# Patient Record
Sex: Male | Born: 1995
Health system: Southern US, Community
[De-identification: ages and names within clinical notes are randomized; demographics above are authoritative.]

## PROBLEM LIST (undated history)

## (undated) DIAGNOSIS — F1010 Alcohol abuse, uncomplicated: Secondary | ICD-10-CM

## (undated) DIAGNOSIS — Z8659 Personal history of other mental and behavioral disorders: Secondary | ICD-10-CM

## (undated) DIAGNOSIS — G43909 Migraine, unspecified, not intractable, without status migrainosus: Secondary | ICD-10-CM

## (undated) HISTORY — DX: Personal history of other mental and behavioral disorders: Z86.59

## (undated) HISTORY — DX: Migraine, unspecified, not intractable, without status migrainosus: G43.909

## (undated) HISTORY — DX: Alcohol abuse, uncomplicated: F10.10

---
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
## 2015-06-11 ENCOUNTER — Encounter: Payer: Self-pay | Admitting: *Deleted

## 2015-06-11 ENCOUNTER — Emergency Department
Admission: EM | Admit: 2015-06-11 | Discharge: 2015-06-11 | Disposition: A | Discharge status: Home / Self Care | Payer: Worker's Compensation | Attending: Emergency Medicine | Admitting: Emergency Medicine

## 2015-06-11 DIAGNOSIS — Y9289 Other specified places as the place of occurrence of the external cause: Secondary | ICD-10-CM | POA: Insufficient documentation

## 2015-06-11 DIAGNOSIS — S61012A Laceration without foreign body of left thumb without damage to nail, initial encounter: Secondary | ICD-10-CM

## 2015-06-11 DIAGNOSIS — Y288XXA Contact with other sharp object, undetermined intent, initial encounter: Secondary | ICD-10-CM | POA: Insufficient documentation

## 2015-06-11 DIAGNOSIS — Y9389 Activity, other specified: Secondary | ICD-10-CM | POA: Diagnosis not present

## 2015-06-11 DIAGNOSIS — Z720 Tobacco use: Secondary | ICD-10-CM | POA: Insufficient documentation

## 2015-06-11 DIAGNOSIS — Y998 Other external cause status: Secondary | ICD-10-CM | POA: Diagnosis not present

## 2015-06-11 DIAGNOSIS — Z23 Encounter for immunization: Secondary | ICD-10-CM | POA: Insufficient documentation

## 2015-06-11 MED ORDER — TETANUS-DIPHTH-ACELL PERTUSSIS 5-2.5-18.5 LF-MCG/0.5 IM SUSP
0.5000 mL | Freq: Once | INTRAMUSCULAR | Status: AC
  Administered 2015-06-11: 0.5 mL via INTRAMUSCULAR
  Filled 2015-06-11: qty 0.5

## 2015-06-11 MED ORDER — LIDOCAINE HCL (PF) 1 % IJ SOLN
10.0000 mL | Freq: Once | INTRAMUSCULAR | Status: AC
  Administered 2015-06-11: 5 mL
  Filled 2015-06-11: qty 10

## 2015-06-11 NOTE — Discharge Instructions (Signed)

## 2015-06-11 NOTE — ED Notes (Signed)
AaoX3.  SKINI WARM AND DRY.  Nad.  D/c HOME

## 2015-06-11 NOTE — ED Notes (Signed)
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 Pt states he may need stitches. Bleeding controled and bandaid placed pta

## 2015-06-11 NOTE — ED Provider Notes (Signed)
[facility] Emergency Department Provider Note  ____________________________________________  Time seen:  11:50 AM  I have reviewed the triage vital signs and the nursing notes.   HISTORY  Chief Complaint Laceration   HPI Jerry Lawrence is a 19 y.o. male is here with laceration to his left thumb. 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 He also has not had a tetanus shot in the last 5 years. Supervisor was contacted and he does not need a drug screen for this workman's comp injury. He denies any pain at this time, bleeding is controlled.   No past medical history on file.  There are no active problems to display for this patient.   No past surgical history on file.  No current outpatient prescriptions on file.  Allergies Review of patient's allergies indicates no known allergies.  No family history on file.  Social History History  Substance Use Topics  . Smoking status: Current Some Day Smoker  . Smokeless tobacco: Not on file  . Alcohol Use: No    Review of Systems Constitutional: No fever/chills Cardiovascular: Denies chest pain. Respiratory: Denies shortness of breath. Musculoskeletal: Negative for back pain. Skin: Negative for rash. Neurological: Negative for headaches  10-point ROS otherwise negative.  ____________________________________________   PHYSICAL EXAM:  VITAL SIGNS: ED Triage Vitals  Enc Vitals Group     BP 06/11/15 1048 128/61 mmHg     Pulse Rate 06/11/15 1048 78     Resp 06/11/15 1048 18     Temp 06/11/15 1048 98.1 F (36.7 C)     Temp Source 06/11/15 1048 Oral     SpO2 06/11/15 1048 100 %     Weight 06/11/15 1048 150 lb (68.04 kg)     Height 06/11/15 1048  (1.778 m)     Head Cir --      Peak Flow --      Pain Score --      Pain Loc --      Pain Edu? --      Excl. in GC? --     Constitutional: Alert and oriented. Well appearing and in no acute distress. Eyes:  Conjunctivae are normal. PERRL. EOMI. Head: Atraumatic. Nose: No congestion/rhinnorhea. Neck: No stridor. Cardiovascular: Normal rate, regular rhythm. Grossly normal heart sounds.  Good peripheral circulation. Respiratory: Normal respiratory effort.  No retractions. Lungs CTAB. Gastrointestinal: Soft and nontender. No distention Musculoskeletal: No lower extremity tenderness nor edema.  No joint effusions. Upper extremities range of motion within normal limits. Neurologic:  Normal speech and language. No gross focal neurologic deficits are appreciated. No gait instability. Skin:  Skin is warm, dry and intact. No rash noted. There is a laceration to the distal portion of the left thumb without active bleeding. Psychiatric: Mood and affect are normal. Speech and behavior are normal.  ____________________________________________   LABS (all labs ordered are listed, but only abnormal results are displayed)  Labs Reviewed - No data to display ____________________________________________  PROCEDURES  LACERATION REPAIR Performed by: Tommi Rumps Authorized by: Tommi Rumps Consent: Verbal consent obtained. Risks and benefits: risks, benefits and alternatives were discussed Consent given by: patient Patient identity confirmed: provided demographic data Prepped and Draped in normal sterile fashion Wound explored  Laceration Location: Left distal thumb   Laceration Leng2.0 No Foreign Bodies seen or palpated  Anesthesia: local infiltration   Local anesthetic: lidocaine 1% without  epinephrine  Anesthetic total:1.5  ml  Irrigation method: syringe  Amount of cleaning: standard  Skin closure: 5-0 Ethilon   Number  of sutures: 3  Technique: Simple interrupted   Patient tolerance: Patient tolerated the procedure well with no immediate complications.  Critical Care performed: No  ____________________________________________   INITIAL IMPRESSION / ASSESSMENT AND PLAN /  ED COURSE  Pertinent labs & imaging results that were available during my care of the patient were reviewed by me and considered in my medical decision making (see chart for details).  Patient was given instructions on how to keep area clean and dry. He is to return for suture removal in 10 days. ____________________________________________   FINAL CLINICAL IMPRESSION(S) / ED DIAGNOSES  Final diagnoses:  Laceration of thumb, left, initial encounter      Tommi Rumps, PA-C 06/11/15 1649  Governor Rooks, MD 06/14/15 [phone]

## 2015-09-28 ENCOUNTER — Ambulatory Visit (INDEPENDENT_AMBULATORY_CARE_PROVIDER_SITE_OTHER): Payer: Managed Care, Other (non HMO) | Admitting: Family Medicine

## 2015-09-28 ENCOUNTER — Encounter: Payer: Self-pay | Admitting: Family Medicine

## 2015-09-28 DIAGNOSIS — K529 Noninfective gastroenteritis and colitis, unspecified: Secondary | ICD-10-CM | POA: Diagnosis not present

## 2015-09-28 DIAGNOSIS — Z8659 Personal history of other mental and behavioral disorders: Secondary | ICD-10-CM | POA: Insufficient documentation

## 2015-09-28 MED ORDER — ONDANSETRON 8 MG PO TBDP: 8.0000 mg | ORAL_TABLET | Freq: Three times a day (TID) | ORAL | Status: DC | PRN
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
## 2015-09-28 NOTE — Patient Instructions (Addendum)
Continue fluid intake and eat as tolerated.

## 2015-09-29 ENCOUNTER — Other Ambulatory Visit: Payer: Self-pay | Admitting: Family Medicine

## 2015-09-29 ENCOUNTER — Telehealth: Payer: Self-pay | Admitting: Family Medicine

## 2015-09-29 DIAGNOSIS — K529 Noninfective gastroenteritis and colitis, unspecified: Secondary | ICD-10-CM

## 2015-09-29 MED ORDER — PROMETHAZINE HCL 25 MG RE SUPP: 25.0000 mg | Freq: Four times a day (QID) | RECTAL | Status: DC | PRN

## 2015-09-29 NOTE — Telephone Encounter (Signed)
Phenergan supps sent in.

## 2015-09-29 NOTE — Telephone Encounter (Signed)
Spoke with patient on the phone who states that he can not keep nausea medication down and has been vomiting this morning. I asked patient if he would be okay if suppository was prescribed to help with nausea, he had no objection. He states that if you can please send it to [facility].KW

## 2015-09-29 NOTE — Telephone Encounter (Signed)
Pt would like a nurse to return his call. Pt stated he was seen yesterday and tried the medication but it didn't seem to help this morning and hasn't been able to keep food down. Please advise. Thanks TNP

## 2015-09-29 NOTE — Telephone Encounter (Signed)
Advised patient that Rx was sent to pharmacy. KW

## 2015-10-02 ENCOUNTER — Encounter: Payer: Self-pay | Admitting: Family Medicine

## 2015-10-02 ENCOUNTER — Ambulatory Visit (INDEPENDENT_AMBULATORY_CARE_PROVIDER_SITE_OTHER): Payer: Managed Care, Other (non HMO) | Admitting: Family Medicine

## 2015-10-02 VITALS — BP 100/62 | HR 95 | Temp 98.0°F | Resp 16 | Wt 146.6 lb

## 2015-10-02 DIAGNOSIS — K529 Noninfective gastroenteritis and colitis, unspecified: Secondary | ICD-10-CM

## 2015-10-02 MED ORDER — HYDROXYZINE HCL 25 MG PO TABS: 25.0000 mg | ORAL_TABLET | Freq: Four times a day (QID) | ORAL | Status: DC | PRN

## 2015-10-02 NOTE — Patient Instructions (Signed)
We will call you with the lab results. Be careful about oversedation with both promethazine and hydroxyzine.

## 2015-10-02 NOTE — Progress Notes (Signed)
Subjective:     Patient ID: Jerry Lawrence, male   DOB: 03-16-1996, 19 y.o.   MRN: [id number]  HPI  Chief Complaint  Patient presents with  . GI Problem    Patient returns to office today for follow up, last seen in office 09/28/15 and diagnosed with gastroenteritis. Patient was prescribed Phenergan 25mg  suppository to help with nausea. Patient states today that suppository helped and he has not vomited since Thursday, he reports nausea in the AM upon awakening and abdonibnal discomfort. Associated symptoms include: night sweats, increased HR, insmonia and heartburn   States he has had normal bowel movement today as he has been able to eat again. Continues to reports  his stomach at times is "a ball of knots".   Review of Systems  Constitutional: Negative for fever and chills.       Objective:   Physical Exam  Constitutional: He appears well-developed and well-nourished. No distress.  Abdominal: Soft. Bowel sounds are normal. There is no tenderness.       Assessment:    1. Gastroenteritis: will  Check labs as symptoms have not resolved.  Suspect stress component to his symptoms. - Comprehensive metabolic panel - CBC with Differential/Platelet - Lipase - hydrOXYzine (ATARAX/VISTARIL) 25 MG tablet; Take 1 tablet (25 mg total) by mouth every 6 (six) hours as needed for anxiety or nausea.  Dispense: 15 tablet; Refill: 0    Plan:    Further f/u pending labs. Work excuse for 11/11-11/14.

## 2015-10-03 ENCOUNTER — Telehealth: Payer: Self-pay

## 2015-10-03 LAB — COMPREHENSIVE METABOLIC PANEL
ALT: 17 IU/L (ref 0–44)
AST: 17 IU/L (ref 0–40)
Albumin/Globulin Ratio: 2.2 (ref 1.1–2.5)
Albumin: 4.8 g/dL (ref 3.5–5.5)
Alkaline Phosphatase: 87 IU/L (ref 39–117)
BUN/Creatinine Ratio: 10 (ref 8–19)
BUN: 8 mg/dL (ref 6–20)
Bilirubin Total: 0.4 mg/dL (ref 0.0–1.2)
CO2: 23 mmol/L (ref 18–29)
Calcium: 9.8 mg/dL (ref 8.7–10.2)
Chloride: 100 mmol/L (ref 97–106)
Creatinine, Ser: 0.81 mg/dL (ref 0.76–1.27)
GFR calc Af Amer: 149 mL/min/{1.73_m2} (ref >59)
GFR calc non Af Amer: 129 mL/min/{1.73_m2} (ref >59)
Globulin, Total: 2.2 g/dL (ref 1.5–4.5)
Glucose: 107 mg/dL — ABNORMAL HIGH (ref 65–99)
Potassium: 4.2 mmol/L (ref 3.5–5.2)
Sodium: 138 mmol/L (ref 136–144)
Total Protein: 7 g/dL (ref 6.0–8.5)

## 2015-10-03 LAB — CBC WITH DIFFERENTIAL/PLATELET
Basophils Absolute: 0 10*3/uL (ref 0.0–0.2)
Basos: 1 %
EOS (ABSOLUTE): 0.3 10*3/uL (ref 0.0–0.4)
Eos: 4 %
Hematocrit: 44.9 % (ref 37.5–51.0)
Hemoglobin: 15.7 g/dL (ref 12.6–17.7)
Immature Grans (Abs): 0 10*3/uL (ref 0.0–0.1)
Immature Granulocytes: 0 %
Lymphocytes Absolute: 2.5 10*3/uL (ref 0.7–3.1)
Lymphs: 38 %
MCH: 31.6 pg (ref 26.6–33.0)
MCHC: 35 g/dL (ref 31.5–35.7)
MCV: 90 fL (ref 79–97)
Monocytes Absolute: 0.6 10*3/uL (ref 0.1–0.9)
Monocytes: 10 %
Neutrophils Absolute: 3.2 10*3/uL (ref 1.4–7.0)
Neutrophils: 47 %
Platelets: 279 10*3/uL (ref 150–379)
RBC: 4.97 x10E6/uL (ref 4.14–5.80)
RDW: 13.6 % (ref 12.3–15.4)
WBC: 6.6 10*3/uL (ref 3.4–10.8)

## 2015-10-03 LAB — LIPASE: Lipase: 21 U/L (ref 0–59)

## 2015-10-03 NOTE — Telephone Encounter (Signed)
Patient has been advised of lab report. KW 

## 2015-10-03 NOTE — Telephone Encounter (Signed)
-----   Message from Anola Gurneyobert Chauvin, [place] sent at 10/03/2015  7:44 AM EST ----- All labs are normal-no sign of infection or liver/pancreas irritation. I think getting back to work will help using the hydroxyzine as needed.

## 2016-07-26 ENCOUNTER — Emergency Department
Admission: EM | Admit: 2016-07-26 | Discharge: 2016-07-26 | Disposition: A | Discharge status: Home / Self Care | Payer: Self-pay | Attending: Emergency Medicine | Admitting: Emergency Medicine

## 2016-07-26 ENCOUNTER — Encounter: Payer: Self-pay | Admitting: Emergency Medicine

## 2016-07-26 DIAGNOSIS — Y929 Unspecified place or not applicable: Secondary | ICD-10-CM | POA: Insufficient documentation

## 2016-07-26 DIAGNOSIS — F17200 Nicotine dependence, unspecified, uncomplicated: Secondary | ICD-10-CM | POA: Insufficient documentation

## 2016-07-26 DIAGNOSIS — S61219A Laceration without foreign body of unspecified finger without damage to nail, initial encounter: Secondary | ICD-10-CM

## 2016-07-26 DIAGNOSIS — W260XXA Contact with knife, initial encounter: Secondary | ICD-10-CM | POA: Insufficient documentation

## 2016-07-26 DIAGNOSIS — F909 Attention-deficit hyperactivity disorder, unspecified type: Secondary | ICD-10-CM | POA: Insufficient documentation

## 2016-07-26 DIAGNOSIS — Y9389 Activity, other specified: Secondary | ICD-10-CM | POA: Insufficient documentation

## 2016-07-26 DIAGNOSIS — S61213A Laceration without foreign body of left middle finger without damage to nail, initial encounter: Secondary | ICD-10-CM | POA: Insufficient documentation

## 2016-07-26 DIAGNOSIS — Y990 Civilian activity done for income or pay: Secondary | ICD-10-CM | POA: Insufficient documentation

## 2016-07-26 NOTE — ED Provider Notes (Signed)
[facility] Emergency Department Provider Note  ____________________________________________  Time seen: Approximately 12:30 PM  I have reviewed the triage vital signs and the nursing notes.   HISTORY  Chief Complaint Laceration    HPI Jerry Lawrence is a 20 y.o. male who presents emergency department for laceration to the third digit of the left hand. 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 Patient reports that he was able to control bleeding with direct pressure. He reports that there is a skin flap to the distal aspect of his finger. Patient is up-to-date on tetanus. Full range of motion of finger. No other injury or complaint. No medications prior to arrival. Pain is minimal at this time.   Past Medical History:  Diagnosis Date  . History of attention deficit hyperactivity disorder (ADHD)     Patient Active Problem List   Diagnosis Date Noted  . History of ADHD 09/28/2015    Past Surgical History:  Procedure Laterality Date  . FINGER SURGERY  06/2011   left hand 5th finger    Prior to Admission medications   Medication Sig Start Date End Date Taking? Authorizing Provider  hydrOXYzine (ATARAX/VISTARIL) 25 MG tablet Take 1 tablet (25 mg total) by mouth every 6 (six) hours as needed for anxiety or nausea. 10/02/15   Anola Gurney, PA  promethazine (PHENERGAN) 25 MG suppository Place 1 suppository (25 mg total) rectally every 6 (six) hours as needed for nausea or vomiting. 09/29/15   Anola Gurney, PA    Allergies Review of patient's allergies indicates no known allergies.  No family history on file.  Social History Social History  Substance Use Topics  . Smoking status: Current Some Day Smoker  . Smokeless tobacco: Never Used  . Alcohol use No     Review of Systems  Constitutional: No fever/chills Cardiovascular: no chest pain. Respiratory: no  cough. No SOB. Musculoskeletal: Negative for musculoskeletal pain. Skin: Laceration distal aspect 3rd digit Neurological: Negative for headaches, focal weakness or numbness. 10-point ROS otherwise negative.  ____________________________________________   PHYSICAL EXAM:  VITAL SIGNS: ED Triage Vitals  Enc Vitals Group     BP 07/26/16 1133 107/74     Pulse Rate 07/26/16 1133 87     Resp 07/26/16 1133 20     Temp 07/26/16 1133 98.1 F (36.7 C)     Temp Source 07/26/16 1133 Oral     SpO2 07/26/16 1133 100 %     Weight 07/26/16 1134 155 lb (70.3 kg)     Height 07/26/16 1134 6' (1.829 m)     Head Circumference --      Peak Flow --      Pain Score 07/26/16 1132 2     Pain Loc --      Pain Edu? --      Excl. in GC? --      Constitutional: Alert and oriented. Well appearing and in no acute distress. Eyes: Conjunctivae are normal. PERRL. EOMI. Head: Atraumatic. Cardiovascular: Normal rate, regular rhythm. Normal S1 and S2.  Good peripheral circulation. Respiratory: Normal respiratory effort without tachypnea or retractions. Lungs CTAB. Good air entry to the bases with no decreased or absent breath sounds. Musculoskeletal: Full range of motion to all extremities. No gross deformities appreciated. Neurologic:  Normal speech and language. No gross focal neurologic deficits are appreciated.  Skin:  Skin is warm, dry and intact. No rash noted.Flap laceration noted to  the distal aspect of the third digit left hand. Laceration extends approximately 1 cm. No bleeding. No foreign body. Full range of motion to digit. Sensation and cap refill intact to third digit left hand. Psychiatric: Mood and affect are normal. Speech and behavior are normal. Patient exhibits appropriate insight and judgement.   ____________________________________________   LABS (all labs ordered are listed, but only abnormal results are displayed)  Labs Reviewed - No data to  display ____________________________________________  EKG   ____________________________________________  RADIOLOGY   No results found.  ____________________________________________    PROCEDURES  Procedure(s) performed:    Marland Kitchen.Marland Kitchen.Laceration Repair Date/Time: 07/26/2016 1:55 PM Performed by: Gala RomneyUTHRIELL, JONATHAN D Authorized by: Gala RomneyUTHRIELL, JONATHAN D   Consent:    Consent obtained:  Verbal   Consent given by:  Patient   Risks discussed:  Poor cosmetic result Anesthesia (see MAR for exact dosages):    Anesthesia method:  None Laceration details:    Location:  Finger   Finger location:  L long finger   Length (cm):  1 Repair type:    Repair type:  Simple Exploration:    Hemostasis achieved with:  Direct pressure   Wound exploration: wound explored through full range of motion   Treatment:    Area cleansed with:  Hibiclens   Amount of cleaning:  Standard   Irrigation solution:  Sterile saline   Irrigation method:  Syringe Skin repair:    Repair method:  Tissue adhesive Approximation:    Approximation:  Close Post-procedure details:    Dressing:  Splint for protection   Patient tolerance of procedure:  Tolerated well, no immediate complications       Medications - No data to display   ____________________________________________   INITIAL IMPRESSION / ASSESSMENT AND PLAN / ED COURSE  Pertinent labs & imaging results that were available during my care of the patient were reviewed by me and considered in my medical decision making (see chart for details).  Review of the [place] CSRS was performed in accordance of the [facility] prior to dispensing any controlled drugs.  Clinical Course    Patient's diagnosis is consistent with Laceration to the third digit of the left hand. This is treated as described above. Patient's finger is splinted for protection. Patient is given wound care structures. Patient will follow-up with primary care as needed. Tetanus is up-to-date  and not renewed at this time. Patient is given ED precautions to return to the ED for any worsening or new symptoms.     ____________________________________________  FINAL CLINICAL IMPRESSION(S) / ED DIAGNOSES  Final diagnoses:  Laceration of finger, initial encounter      NEW MEDICATIONS STARTED DURING THIS VISIT:  Discharge Medication List as of 07/26/2016  1:40 PM          This chart was dictated using voice recognition software/Dragon. Despite best efforts to proofread, errors can occur which can change the meaning. Any change was purely unintentional.    Racheal PatchesJonathan D Cuthriell, PA-C 07/26/16 1403    Jeanmarie PlantJames A McShane, MD 07/26/16 [phone]

## 2016-07-26 NOTE — ED Notes (Signed)
Workers Comp  Per Patient workers comp not needed. WC for [employer] not on file with [facility]

## 2016-07-26 NOTE — ED Triage Notes (Signed)
Laceration to left middle finger with knife at work

## 2018-03-18 ENCOUNTER — Encounter: Payer: Self-pay | Admitting: *Deleted

## 2018-03-18 ENCOUNTER — Emergency Department: Payer: Managed Care, Other (non HMO)

## 2018-03-18 ENCOUNTER — Emergency Department
Admission: EM | Admit: 2018-03-18 | Discharge: 2018-03-18 | Disposition: A | Discharge status: Home / Self Care | Payer: Managed Care, Other (non HMO) | Attending: Emergency Medicine | Admitting: Emergency Medicine

## 2018-03-18 DIAGNOSIS — Y9389 Activity, other specified: Secondary | ICD-10-CM | POA: Diagnosis not present

## 2018-03-18 DIAGNOSIS — T07XXXA Unspecified multiple injuries, initial encounter: Secondary | ICD-10-CM | POA: Insufficient documentation

## 2018-03-18 DIAGNOSIS — F1010 Alcohol abuse, uncomplicated: Secondary | ICD-10-CM | POA: Diagnosis not present

## 2018-03-18 DIAGNOSIS — S0083XA Contusion of other part of head, initial encounter: Secondary | ICD-10-CM

## 2018-03-18 DIAGNOSIS — F17200 Nicotine dependence, unspecified, uncomplicated: Secondary | ICD-10-CM | POA: Insufficient documentation

## 2018-03-18 DIAGNOSIS — S01511A Laceration without foreign body of lip, initial encounter: Secondary | ICD-10-CM | POA: Diagnosis not present

## 2018-03-18 DIAGNOSIS — S0990XA Unspecified injury of head, initial encounter: Secondary | ICD-10-CM | POA: Diagnosis present

## 2018-03-18 DIAGNOSIS — Y929 Unspecified place or not applicable: Secondary | ICD-10-CM | POA: Diagnosis not present

## 2018-03-18 DIAGNOSIS — Y999 Unspecified external cause status: Secondary | ICD-10-CM | POA: Insufficient documentation

## 2018-03-18 LAB — COMPREHENSIVE METABOLIC PANEL
ALT: 17 U/L (ref 17–63)
AST: 23 U/L (ref 15–41)
Albumin: 4.5 g/dL (ref 3.5–5.0)
Alkaline Phosphatase: 68 U/L (ref 38–126)
Anion gap: 8 (ref 5–15)
BUN: 12 mg/dL (ref 6–20)
CO2: 24 mmol/L (ref 22–32)
Calcium: 8.6 mg/dL — ABNORMAL LOW (ref 8.9–10.3)
Chloride: 106 mmol/L (ref 101–111)
Creatinine, Ser: 0.75 mg/dL (ref 0.61–1.24)
GFR calc Af Amer: >60 mL/min (ref >60)
GFR calc non Af Amer: >60 mL/min (ref >60)
Glucose, Bld: 106 mg/dL — ABNORMAL HIGH (ref 65–99)
Potassium: 4 mmol/L (ref 3.5–5.1)
Sodium: 138 mmol/L (ref 135–145)
Total Bilirubin: 0.6 mg/dL (ref 0.3–1.2)
Total Protein: 7.5 g/dL (ref 6.5–8.1)

## 2018-03-18 LAB — CBC WITH DIFFERENTIAL/PLATELET
Basophils Absolute: 0 10*3/uL (ref 0–0.1)
Basophils Relative: 1 %
Eosinophils Absolute: 0.1 10*3/uL (ref 0–0.7)
Eosinophils Relative: 2 %
HCT: 45.4 % (ref 40.0–52.0)
Hemoglobin: 15.9 g/dL (ref 13.0–18.0)
Lymphocytes Relative: 22 %
Lymphs Abs: 1.9 10*3/uL (ref 1.0–3.6)
MCH: 32.6 pg (ref 26.0–34.0)
MCHC: 35.1 g/dL (ref 32.0–36.0)
MCV: 92.9 fL (ref 80.0–100.0)
Monocytes Absolute: 0.7 10*3/uL (ref 0.2–1.0)
Monocytes Relative: 8 %
Neutro Abs: 6 10*3/uL (ref 1.4–6.5)
Neutrophils Relative %: 67 %
Platelets: 269 10*3/uL (ref 150–440)
RBC: 4.88 MIL/uL (ref 4.40–5.90)
RDW: 13.3 % (ref 11.5–14.5)
WBC: 8.8 10*3/uL (ref 3.8–10.6)

## 2018-03-18 LAB — URINE DRUG SCREEN, QUALITATIVE (ARMC ONLY)
Amphetamines, Ur Screen: NOT DETECTED
Barbiturates, Ur Screen: NOT DETECTED
Benzodiazepine, Ur Scrn: NOT DETECTED
Cannabinoid 50 Ng, Ur ~~LOC~~: POSITIVE — AB
Cocaine Metabolite,Ur ~~LOC~~: NOT DETECTED
MDMA (Ecstasy)Ur Screen: NOT DETECTED
Methadone Scn, Ur: NOT DETECTED
Opiate, Ur Screen: NOT DETECTED
Phencyclidine (PCP) Ur S: NOT DETECTED
Tricyclic, Ur Screen: NOT DETECTED

## 2018-03-18 LAB — URINALYSIS, COMPLETE (UACMP) WITH MICROSCOPIC
Bacteria, UA: NONE SEEN
Bilirubin Urine: NEGATIVE
Glucose, UA: NEGATIVE mg/dL
Hgb urine dipstick: NEGATIVE
Ketones, ur: NEGATIVE mg/dL
Leukocytes, UA: NEGATIVE
Nitrite: NEGATIVE
Protein, ur: NEGATIVE mg/dL
Specific Gravity, Urine: 1.008 (ref 1.005–1.030)
Squamous Epithelial / LPF: NONE SEEN (ref 0–5)
pH: 6 (ref 5.0–8.0)

## 2018-03-18 LAB — ETHANOL: Alcohol, Ethyl (B): 136 mg/dL — ABNORMAL HIGH (ref <10)

## 2018-03-18 LAB — ACETAMINOPHEN LEVEL: Acetaminophen (Tylenol), Serum: <10 ug/mL — ABNORMAL LOW (ref 10–30)

## 2018-03-18 LAB — SALICYLATE LEVEL: Salicylate Lvl: <7 mg/dL (ref 2.8–30.0)

## 2018-03-18 MED ORDER — BUPIVACAINE HCL (PF) 0.5 % IJ SOLN
10.0000 mL | Freq: Once | INTRAMUSCULAR | Status: AC
  Administered 2018-03-18: 10 mL

## 2018-03-18 MED ORDER — ACETAMINOPHEN 325 MG PO TABS
650.0000 mg | ORAL_TABLET | Freq: Once | ORAL | Status: AC
  Administered 2018-03-18: 650 mg via ORAL

## 2018-03-18 MED ORDER — SODIUM CHLORIDE 0.9 % IV BOLUS
500.0000 mL | Freq: Once | INTRAVENOUS | Status: AC
  Administered 2018-03-18: 500 mL via INTRAVENOUS

## 2018-03-18 MED ORDER — ACETAMINOPHEN 325 MG PO TABS
ORAL_TABLET | ORAL | Status: AC
  Filled 2018-03-18: qty 2

## 2018-03-18 MED ORDER — AMOXICILLIN-POT CLAVULANATE 875-125 MG PO TABS: 1.0000 | ORAL_TABLET | Freq: Two times a day (BID) | ORAL | 0 refills | Status: AC

## 2018-03-18 MED ORDER — AMOXICILLIN-POT CLAVULANATE 875-125 MG PO TABS
1.0000 | ORAL_TABLET | Freq: Once | ORAL | Status: AC
  Administered 2018-03-18: 1 via ORAL
  Filled 2018-03-18: qty 1

## 2018-03-18 MED ORDER — BUPIVACAINE HCL (PF) 0.5 % IJ SOLN
INTRAMUSCULAR | Status: AC
  Administered 2018-03-18: 10 mL
  Filled 2018-03-18: qty 30

## 2018-03-18 MED ORDER — LIDOCAINE HCL (PF) 1 % IJ SOLN
5.0000 mL | Freq: Once | INTRAMUSCULAR | Status: DC
  Filled 2018-03-18: qty 5
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
## 2018-03-18 NOTE — ED Notes (Signed)
3 sutures applied to pt lip by MD York Cerise

## 2018-03-18 NOTE — ED Notes (Signed)
ED Provider at bedside for suturing.  
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
## 2018-03-18 NOTE — ED Notes (Signed)
SANE nurse in room to assess patient.

## 2018-03-18 NOTE — ED Notes (Signed)
Dr Alphonzo Lemmings notified that pt is refusing SANE Per Dr Alphonzo Lemmings to PO challenge pt and then have him ambulate and he can be discharged Pt is requesting Tylenol for headache - VO Dr Alphonzo Lemmings for tylenol  now Pt given ginger ale
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
## 2018-03-18 NOTE — ED Notes (Signed)
SANE RN Mardella Layman states ETA approx 1 hour
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
## 2018-03-18 NOTE — SANE Note (Signed)
Follow-up Phone Call  Patient gives verbal consent for a FNE/SANE follow-up phone call in 48-72 hours: I WAS NOT ABLE TO OBTAIN CONSENT FROM THE PT TO SIGN THE ROIs OR TO HAVE THE PT CONTACTED FOR A FOLLOW-UP PHONE CALL. Patient's telephone number: [phone] (PT'S CELL W/ VOICEMAIL & TEXTING) Patient gives verbal consent to leave voicemail at the phone number listed above: I WAS NOT ABLE TO OBTAIN CONSENT FROM THE PT. DO NOT CALL between the hours of: N/A  EMAIL ADDRESS:  [email or web address]   ([place] POLICE DEPARTMENT CASE NUMBER:  [phone] OFFICER [place]

## 2018-03-18 NOTE — ED Notes (Signed)
Pt. Returned to tx. room in stable condition with no acute changes since departure from unit for scans.   
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
## 2018-04-10 ENCOUNTER — Telehealth: Payer: Managed Care, Other (non HMO) | Admitting: Nurse Practitioner

## 2018-04-10 DIAGNOSIS — S80862A Insect bite (nonvenomous), left lower leg, initial encounter: Secondary | ICD-10-CM

## 2018-04-10 DIAGNOSIS — S60463A Insect bite (nonvenomous) of left middle finger, initial encounter: Secondary | ICD-10-CM

## 2018-04-10 DIAGNOSIS — W57XXXA Bitten or stung by nonvenomous insect and other nonvenomous arthropods, initial encounter: Secondary | ICD-10-CM

## 2018-04-10 MED ORDER — PREDNISONE 10 MG (21) PO TBPK: ORAL_TABLET | ORAL | 0 refills | Status: DC

## 2018-04-10 MED ORDER — SULFAMETHOXAZOLE-TRIMETHOPRIM 800-160 MG PO TABS: 1.0000 | ORAL_TABLET | Freq: Two times a day (BID) | ORAL | 0 refills | Status: DC

## 2018-04-10 NOTE — Progress Notes (Signed)
E Visit for Insect Sting  Thank you for describing the insect sting for [place].  Here is how we plan to help!  Based on the information you have shared with me it looks like you have:  The 2 greatest risks from insect stings are allergic reaction, which can be fatal in some people and infection, which is more common and less serious.  Bees, wasps, yellow jackets, and hornets belong to a class of insects called Hymenoptera.  Most insect stings cause only minor discomfort.  Stings can happen anywhere on the body and can be painful.  Most stings are from honey bees or yellow jackets.  Fire ants can sting multiple times.  The sites of the stings are more likely to become infected.    I have sent in prednisone 10 mg tapering dose for 5 days to the pharmacy you selected.  Please make sure that you selected a pharmacy that is open now. and Your symptoms indicate that you need a longer course of antibiotics and a follow up visit with a provider.  I have sent prednisone 10 mg tapering dose for 5 days to the pharmacy that you selected.  You will need to schedule a follow up visit with your provider.  If you do not have a primary care provider you may use our telehealth physicians on the web at [facility].  What can be used to prevent Insect Stings?   Insect repellant with at least 20% DEET.    Wearing long pants and shirts with socks and shoes.    Wear dark or drab-colored clothes rather than bright colors.    Avoid using perfumes and hair sprays; these attract insects.  HOME CARE ADVICE:  1. Stinger removal:  The stinger looks like a tiny black dot in the sting.  Use a fingernail, credit card edge, or knife-edge to scrape it off.  Don't pull it out because it squeezes out more venom.  If the stinger is below the skin surface, leave it alone.  It will be shed with normal skin healing. 2. Use cold compresses to the area of the sting for 10-20 minutes.  You may repeat this as needed to  relieve symptoms of pain and swelling. 3.  For pain relief, take acetominophen 650 mg 4-6 hours as needed or ibuprofen 400 mg every 6-8 hours as needed or naproxen 250-500 mg every 12 hours as needed. 4.  You can also use hydrocortisone cream 0.5% or 1% up to 4 times daily as needed for itching. 5.  If the sting becomes very itchy, take Benadryl 25-50 mg, follow directions on box. 6.  Wash the area 2-3 times daily with antibacterial soap and warm water. 7. Call your Doctor if:  Fever, a severe headache, or rash occur in the next 2 weeks.  Sting area begins to look infected.  Redness and swelling worsens after home treatment.  Your current symptoms become worse.    MAKE SURE YOU:   Understand these instructions.  Will watch your condition.  Will get help right away if you are not doing well or get worse.  Thank you for choosing an e-visit. Your e-visit answers were reviewed by a board certified advanced clinical practitioner to complete your personal care plan. Depending upon the condition, your plan could have included both over the counter or prescription medications. Please review your pharmacy choice. Be sure that the pharmacy you have chosen is open so that you can pick up your prescription now.  If there  is a problem you may message your provider in MyChart to have the prescription routed to another pharmacy. Your safety is important to [place]. If you have drug allergies check your prescription carefully.  For the next 24 hours, you can use MyChart to ask questions about today's visit, request a non-urgent call back, or ask for a work or school excuse from your e-visit provider. You will get an email in the next two days asking about your experience. I hope that your e-visit has been valuable and will speed your recovery.

## 2018-12-31 ENCOUNTER — Encounter: Payer: Self-pay | Admitting: Emergency Medicine

## 2018-12-31 ENCOUNTER — Other Ambulatory Visit: Payer: Self-pay

## 2018-12-31 ENCOUNTER — Emergency Department
Admission: EM | Admit: 2018-12-31 | Discharge: 2018-12-31 | Disposition: A | Discharge status: Home / Self Care | Payer: 59 | Attending: Emergency Medicine | Admitting: Emergency Medicine

## 2018-12-31 DIAGNOSIS — Z79899 Other long term (current) drug therapy: Secondary | ICD-10-CM | POA: Insufficient documentation

## 2018-12-31 DIAGNOSIS — F17210 Nicotine dependence, cigarettes, uncomplicated: Secondary | ICD-10-CM | POA: Diagnosis not present

## 2018-12-31 DIAGNOSIS — F10221 Alcohol dependence with intoxication delirium: Secondary | ICD-10-CM | POA: Diagnosis present

## 2018-12-31 DIAGNOSIS — F10120 Alcohol abuse with intoxication, uncomplicated: Secondary | ICD-10-CM | POA: Insufficient documentation

## 2018-12-31 DIAGNOSIS — F919 Conduct disorder, unspecified: Secondary | ICD-10-CM | POA: Insufficient documentation

## 2018-12-31 DIAGNOSIS — F10920 Alcohol use, unspecified with intoxication, uncomplicated: Secondary | ICD-10-CM

## 2018-12-31 LAB — URINE DRUG SCREEN, QUALITATIVE (ARMC ONLY)
Amphetamines, Ur Screen: NOT DETECTED
Barbiturates, Ur Screen: NOT DETECTED
Benzodiazepine, Ur Scrn: NOT DETECTED
Cannabinoid 50 Ng, Ur ~~LOC~~: POSITIVE — AB
Cocaine Metabolite,Ur ~~LOC~~: NOT DETECTED
MDMA (Ecstasy)Ur Screen: NOT DETECTED
Methadone Scn, Ur: NOT DETECTED
Opiate, Ur Screen: NOT DETECTED
Phencyclidine (PCP) Ur S: NOT DETECTED
Tricyclic, Ur Screen: NOT DETECTED

## 2018-12-31 LAB — CBC
HCT: 48 % (ref 39.0–52.0)
Hemoglobin: 16.9 g/dL (ref 13.0–17.0)
MCH: 32.3 pg (ref 26.0–34.0)
MCHC: 35.2 g/dL (ref 30.0–36.0)
MCV: 91.8 fL (ref 80.0–100.0)
Platelets: 321 10*3/uL (ref 150–400)
RBC: 5.23 MIL/uL (ref 4.22–5.81)
RDW: 11.5 % (ref 11.5–15.5)
WBC: 8.5 10*3/uL (ref 4.0–10.5)
nRBC: 0 % (ref 0.0–0.2)

## 2018-12-31 LAB — COMPREHENSIVE METABOLIC PANEL
ALT: 29 U/L (ref 0–44)
AST: 32 U/L (ref 15–41)
Albumin: 5 g/dL (ref 3.5–5.0)
Alkaline Phosphatase: 75 U/L (ref 38–126)
Anion gap: 7 (ref 5–15)
BUN: 14 mg/dL (ref 6–20)
CO2: 23 mmol/L (ref 22–32)
Calcium: 9.1 mg/dL (ref 8.9–10.3)
Chloride: 111 mmol/L (ref 98–111)
Creatinine, Ser: 0.96 mg/dL (ref 0.61–1.24)
GFR calc Af Amer: >60 mL/min (ref >60)
GFR calc non Af Amer: >60 mL/min (ref >60)
Glucose, Bld: 115 mg/dL — ABNORMAL HIGH (ref 70–99)
Potassium: 3.9 mmol/L (ref 3.5–5.1)
Sodium: 141 mmol/L (ref 135–145)
Total Bilirubin: 0.5 mg/dL (ref 0.3–1.2)
Total Protein: 8 g/dL (ref 6.5–8.1)

## 2018-12-31 LAB — SALICYLATE LEVEL: Salicylate Lvl: <7 mg/dL (ref 2.8–30.0)

## 2018-12-31 LAB — ETHANOL: Alcohol, Ethyl (B): 247 mg/dL — ABNORMAL HIGH (ref <10)

## 2018-12-31 LAB — ACETAMINOPHEN LEVEL: Acetaminophen (Tylenol), Serum: <10 ug/mL — ABNORMAL LOW (ref 10–30)

## 2018-12-31 MED ORDER — NICOTINE 21 MG/24HR TD PT24
21.0000 mg | MEDICATED_PATCH | Freq: Once | TRANSDERMAL | Status: DC
  Administered 2018-12-31: 21 mg via TRANSDERMAL
  Filled 2018-12-31: qty 1
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
## 2018-12-31 NOTE — ED Notes (Signed)
Hourly rounding reveals patient in room. No complaints, stable, in no acute distress. Q15 minute rounds and monitoring via Rover and Officer to continue.   

## 2018-12-31 NOTE — ED Provider Notes (Signed)
-----------------------------------------   11:02 AM on 12/31/2018 -----------------------------------------   Blood pressure 140/88, pulse (!) 115, temperature 97.7 F (36.5 C), temperature source Oral, resp. rate 18, height 6\' 1"  (1.854 m), weight 127 kg, SpO2 98 %.  Patient has been evaluated by psychiatry and cleared for discharge. IVC lifted by Dr. Viviano Simas. Patient's labs have been reviewed with no acute findings. Patient will be discharged at this time to home    Don Perking, [place], MD 12/31/18 1102

## 2018-12-31 NOTE — Progress Notes (Signed)
[facility] MD Progress Note  12/31/2018 11:04 AM Jerry Lawrence  MRN:  [id number] Subjective:  "I am  Principal Problem: Alcohol intoxication delirium with moderate or severe use disorder (HCC) Diagnosis: Principal Problem:   Alcohol intoxication delirium with moderate or severe use disorder (HCC)  Total Time spent with patient: 30 minutes   Patient is seen for reevaluation after metabolization of alcohol.  Per intake HPI Jerry Lawrence is a 23 y.o. male patient who was presented to [facility] ED via [employer]. The patient was seen by this provider face -to- face; chart reviewed and consulted with Dr. Dolores Frame on the patient plan of care. It was discussed with Dr. Dolores Frame that the patient will be observed overnight and he can be re-assessed in the morning. On evaluation [name] is alert and oriented x 4, agitated and un-cooperative, he is crying requesting to talk to his mom. The patient mom was called by the ER nurse and a message was left.  The patient does not appear to be responding to internal or external stimuli. Neither is the patient presenting with any delusional thinking. The patient denies any suicidal, homicidal, or self-harm ideations. The patient is not presenting with any psychotic or paranoid behaviors. During the encounter with the patient, he was able to answer most questions appropriately. The patient stated that he is aware that he drank too much, but voiced that he was not trying to walk into traffic, or steal anybodies car. He was extremely remorseful and voiced that he knows he has disappointment many people in his family. The patient denies  currently taking any prescription medication. The patient was positive cannabinoid and alcohol.    On reevaluation, patient reports that he was working, and then went out with friends.  He did endorse drinking and using marijuana, but does not remember anything until he then woke up in the emergency department.  Patient is aware that he has  charges pending for his behaviors last night.  Patient also reports that he has past legal issues with driving under the influence charges from 2 years ago.  He has had his court dates postponed on multiple occasions and his next court date is January 18, 2019.  Patient is denying SI, HI, AVH.  He is hoping that he can be discharged so that he can go back to work, and be able to make his court date on March 2.  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  He has had this job for 4 years.  He denies any difficulty with behaviors at work.  Patient previously had been at [employer], and does hope to return to school in order to study criminal justice at some point.  Patient describes a history of depression, and self-harm as a teenager.  He reports that his last self-harm was 6 to 7 years ago at 23 years of age.  Patient states that he did begin drinking alcohol at age 24, but began drinking on a daily basis after his 21st birthday.  Patient reports that he is aware of his triggers to drink, and notes he needs to "change my life." Patient states his mother was working nights last night, and he was not able to talk to her.  He states that he will be able to take an uber in order to get to work today.  He was supposed to start at 11 AM, and asks if he can call his employer to let them know  he will be late to work.  Inpatient psychiatric hospitalization at age 72 years was in [facility] [place]  Past Medical History:  Past Medical History:  Diagnosis Date  . History of attention deficit hyperactivity disorder (ADHD)     Past Surgical History:  Procedure Laterality Date  . FINGER SURGERY  06/2011   left hand 5th finger   Family History: No family history on file. Family Psychiatric  History: Denies Social History:  Social History   Substance and Sexual Activity  Alcohol Use Yes     Social History   Substance and Sexual Activity  Drug Use  Never    Social History   Socioeconomic History  . Marital status: Single    Spouse name: Not on file  . Number of children: Not on file  . Years of education: Not on file  . Highest education level: Not on file  Occupational History  . Not on file  Social Needs  . Financial resource strain: Not on file  . Food insecurity:    Worry: Not on file    Inability: Not on file  . Transportation needs:    Medical: Not on file    Non-medical: Not on file  Tobacco Use  . Smoking status: Current Some Day Smoker  . Smokeless tobacco: Never Used  Substance and Sexual Activity  . Alcohol use: Yes  . Drug use: Never  . Sexual activity: Not on file  Lifestyle  . Physical activity:    Days per week: Not on file    Minutes per session: Not on file  . Stress: Not on file  Relationships  . Social connections:    Talks on phone: Not on file    Gets together: Not on file    Attends religious service: Not on file    Active member of club or organization: Not on file    Attends meetings of clubs or organizations: Not on file    Relationship status: Not on file  Other Topics Concern  . Not on file  Social History Narrative  . Not on file   Additional Social History:       Living with his parents in [place], [place]. Patient was raised in a Customer service manager family". Patient endorses alcohol and marijuana use.  He denies any other illicit substances.                   Sleep: Fair  Appetite:  Good  Current Medications: Current Facility-Administered Medications  Medication Dose Route Frequency Provider Last Rate Last Dose  . nicotine (NICODERM CQ - dosed in mg/24 hours) patch 21 mg  21 mg Transdermal Once Irean Hong, MD   21 mg at 12/31/18 0350   No current outpatient medications on file.    Lab Results:  Results for orders placed or performed during the hospital encounter of 12/31/18 (from the past 48 hour(s))  Comprehensive metabolic panel     Status: Abnormal    Collection Time: 12/31/18  2:39 AM  Result Value Ref Range   Sodium 141 135 - 145 mmol/L   Potassium 3.9 3.5 - 5.1 mmol/L   Chloride 111 98 - 111 mmol/L   CO2 23 22 - 32 mmol/L   Glucose, Bld 115 (H) 70 - 99 mg/dL   BUN 14 6 - 20 mg/dL   Creatinine, Ser 8.67 0.61 - 1.24 mg/dL   Calcium 9.1 8.9 - 61.9 mg/dL   Total Protein 8.0 6.5 - 8.1 g/dL  Albumin 5.0 3.5 - 5.0 g/dL   AST 32 15 - 41 U/L   ALT 29 0 - 44 U/L   Alkaline Phosphatase 75 38 - 126 U/L   Total Bilirubin 0.5 0.3 - 1.2 mg/dL   GFR calc non Af Amer >60 >60 mL/min   GFR calc Af Amer >60 >60 mL/min   Anion gap 7 5 - 15    Comment: Performed at [facility], [address] [place], [place] [address]  Ethanol     Status: Abnormal   Collection Time: 12/31/18  2:39 AM  Result Value Ref Range   Alcohol, Ethyl (B) 247 (H) <10 mg/dL    Comment: (NOTE) Lowest detectable limit for serum alcohol is 10 mg/dL. For medical purposes only. Performed at [facility], [address] [place], [place] [address]   Salicylate level     Status: None   Collection Time: 12/31/18  2:39 AM  Result Value Ref Range   Salicylate Lvl <7.0 2.8 - 30.0 mg/dL    Comment: Performed at [facility], [address] [place], [place] [address]  Acetaminophen level     Status: Abnormal   Collection Time: 12/31/18  2:39 AM  Result Value Ref Range   Acetaminophen (Tylenol), Serum <10 (L) 10 - 30 ug/mL    Comment: (NOTE) Therapeutic concentrations vary significantly. A range of 10-30 ug/mL  may be an effective concentration for many patients. However, some  are best treated at concentrations outside of this range. Acetaminophen concentrations >150 ug/mL at 4 hours after ingestion  and >50 ug/mL at 12 hours after ingestion are often associated with  toxic reactions. Performed at [facility], [address] [place], [place] [address]   cbc     Status: None   Collection Time: 12/31/18  2:39 AM  Result Value  Ref Range   WBC 8.5 4.0 - 10.5 K/uL   RBC 5.23 4.22 - 5.81 MIL/uL   Hemoglobin 16.9 13.0 - 17.0 g/dL   HCT 53.648.0 64.439.0 - 03.452.0 %   MCV 91.8 80.0 - 100.0 fL   MCH 32.3 26.0 - 34.0 pg   MCHC 35.2 30.0 - 36.0 g/dL   RDW 74.211.5 59.511.5 - 63.815.5 %   Platelets 321 150 - 400 K/uL   nRBC 0.0 0.0 - 0.2 %    Comment: Performed at [facility], [address] [place], [place] [address]  Urine Drug Screen, Qualitative     Status: Abnormal   Collection Time: 12/31/18  2:39 AM  Result Value Ref Range   Tricyclic, Ur Screen NONE DETECTED NONE DETECTED   Amphetamines, Ur Screen NONE DETECTED NONE DETECTED   MDMA (Ecstasy)Ur Screen NONE DETECTED NONE DETECTED   Cocaine Metabolite,Ur [place] NONE DETECTED NONE DETECTED   Opiate, Ur Screen NONE DETECTED NONE DETECTED   Phencyclidine (PCP) Ur S NONE DETECTED NONE DETECTED   Cannabinoid 50 Ng, Ur [place] POSITIVE (A) NONE DETECTED   Barbiturates, Ur Screen NONE DETECTED NONE DETECTED   Benzodiazepine, Ur Scrn NONE DETECTED NONE DETECTED   Methadone Scn, Ur NONE DETECTED NONE DETECTED    Comment: (NOTE) Tricyclics + metabolites, urine    Cutoff 1000 ng/mL Amphetamines + metabolites, urine  Cutoff 1000 ng/mL MDMA (Ecstasy), urine              Cutoff 500 ng/mL Cocaine Metabolite, urine          Cutoff 300 ng/mL Opiate + metabolites, urine  Cutoff 300 ng/mL Phencyclidine (PCP), urine         Cutoff 25 ng/mL Cannabinoid, urine                 Cutoff 50 ng/mL Barbiturates + metabolites, urine  Cutoff 200 ng/mL Benzodiazepine, urine              Cutoff 200 ng/mL Methadone, urine                   Cutoff 300 ng/mL The urine drug screen provides only a preliminary, unconfirmed analytical test result and should not be used for non-medical purposes. Clinical consideration and professional judgment should be applied to any positive drug screen result due to possible interfering substances. A more specific alternate chemical method must be used in order to  obtain a confirmed analytical result. Gas chromatography / mass spectrometry (GC/MS) is the preferred confirmat ory method. Performed at [facility], [address] [place], [place] [address]     Blood Alcohol level:  Lab Results  Component Value Date   ETH 247 (H) 12/31/2018   ETH 136 (H) 03/18/2018    Metabolic Disorder Labs: No results found for: HGBA1C, MPG No results found for: PROLACTIN No results found for: CHOL, TRIG, HDL, CHOLHDL, VLDL, LDLCALC  Physical Findings: AIMS:  , ,  ,  ,    CIWA:    COWS:     Musculoskeletal: Strength & Muscle Tone: within normal limits Gait & Station: normal Patient leans: N/A  Psychiatric Specialty Exam: Physical Exam  ROS  Blood pressure 140/88, pulse (!) 115, temperature 97.7 F (36.5 C), temperature source Oral, resp. rate 18, height 6\' 1"  (1.854 m), weight 127 kg, SpO2 98 %.Body mass index is 36.94 kg/m.  General Appearance: Casual  Eye Contact:  Good  Speech:  Clear and Coherent and Normal Rate  Volume:  Normal  Mood:  Euthymic  Affect:  Appropriate  Thought Process:  Goal Directed and Descriptions of Associations: Intact  Orientation:  Full (Time, Place, and Person)  Thought Content:  Logical and Hallucinations: None  Suicidal Thoughts:  No  Homicidal Thoughts:  No  Memory:  Immediate;   Poor Recent;   Fair Remote;   Good  Judgement:  Poor  Insight:  Shallow  Psychomotor Activity:  Normal  Concentration:  Concentration: Good  Recall:  Poor  Fund of Knowledge:  Fair  Language:  Good  Akathisia:  No  Handed:  Right  AIMS (if indicated):     Assets:  Communication Skills Desire for Improvement Housing Social Support Vocational/Educational  ADL's:  Intact  Cognition:  WNL  Sleep:   Adequate     Treatment Plan Summary: Provided patient with resources for outpatient substance use treatment.  Patient does not meet criteria for inpatient admission.  He was future oriented and denying suicidal  ideation, plan, intent. He was able to engage in safety planning including plan to return to nearest emergency department or contact emergency services if he feels unable to maintain his own safety or the safety of others. Patient had no further questions, comments, or concerns. Discharge into care of self.     Mariel CraftSHEILA M Vin Yonke, MD 12/31/2018, 11:04 AM

## 2018-12-31 NOTE — ED Notes (Signed)
Pt. Transferred from Triage to room after dressing out and screening for contraband. Report to include Situation, Background, Assessment and [facility]. Pt. Oriented to Quad including Q15 minute rounds as well as Psychologist, counsellingover and Officer for their protection. Patient is alert and oriented, warm and dry in no acute distress. Patient denies SI, HI, and AVH. Patient admits to drinking unknown amount of alcohol Pt. Encouraged to let me know if needs arise.

## 2018-12-31 NOTE — BH Assessment (Signed)
TTS met with patient briefly to inquire about his desire for treatment for his alcohol/drug use.  Patient reports a desire for treatment.  TTS provided referral information for multiple treatment centers. Jerry Lawrence reports he and his family have contacted many of them but are unable to pay out of pocket the amount they are looking for. TTS encouraged Jerry Lawrence to consider other facilities, and he is agreeable to doing so - including [facility] which offers long term treatment at no cost.  Patient was not willing to call at this time because "I'm due in to work in 30 minutes".

## 2018-12-31 NOTE — ED Notes (Signed)
Hand hygiene performed prior to eating breakfast tray.

## 2018-12-31 NOTE — ED Notes (Signed)
Calil Vanella [phone] (mother)

## 2018-12-31 NOTE — Consult Note (Signed)
[facility] Face-to-Face Psychiatry Consult   Reason for Consult:  Alcohol Intoxication Referring Physician:  Dr. Dolores Lawrence Patient Identification: Jerry Lawrence MRN:  [id number] Principal Diagnosis:  Alcohol intoxication delirium with moderate or severe use disorder (HCC)  Diagnosis:  Active Problems:   Alcohol intoxication delirium with moderate or severe use disorder (HCC)   Total Time spent with patient: 1 hour  Subjective: "Ma'm, I am not suppose to be here." "This is not who I am. Please let me talk to my mom."  Jerry Lawrence is a 23 y.o. male patient who was [facility] to [facility] ED via [employer]. The patient was seen by this provider face -to- face; chart reviewed and consulted with Jerry Lawrence on the patient plan of care. It was discussed with Jerry Lawrence that the patient will be observed overnight and he can be re-assessed in the morning. On evaluation Jerry Lawrence is alert and oriented x 4, agitated and un-cooperative, he is crying requesting to talk to his mom. The patient mom was called by the ER nurse and a message was left.  The patient does not appear to be responding to internal or external stimuli. Neither is the patient presenting with any delusional thinking. The patient denies any suicidal, homicidal, or self-harm ideations. The patient is not presenting with any psychotic or paranoid behaviors. During the encounter with the patient, he was able to answer most questions appropriately. The patient stated that he is aware that he drank too much, but voiced that he was not trying to walk into traffic, or steal anybodies car. He was extremely remorseful and voiced that he knows he has disappointment many people in his family. The patient denies  currently taking any prescription medication. The patient was positive cannabinoid and alcohol.   Collaboration: Patient mom Jerry Lawrence [phone] was called and message left for her to call [facility] ED. HPI:    Past Psychiatric History:   ADHD Substance Used Disorder  Risk to Self:  No Risk to Others:  No Prior Inpatient Therapy:  No Prior Outpatient Therapy:  No  Past Medical History:  None provided Past Medical History:  Diagnosis Date  . History of attention deficit hyperactivity disorder (ADHD)     Past Surgical History:  Procedure Laterality Date  . FINGER SURGERY  06/2011   left hand 5th finger   Family History: No family history on file. Family Psychiatric  History:  None provided Social History:  Tobacco Social History   Substance and Sexual Activity  Alcohol Use Yes     Social History   Substance and Sexual Activity  Drug Use Never    Social History   Socioeconomic History  . Marital status: Single    Spouse name: Not on file  . Number of children: Not on file  . Years of education: Not on file  . Highest education level: Not on file  Occupational History  . Not on file  Social Needs  . Financial resource strain: Not on file  . Food insecurity:    Worry: Not on file    Inability: Not on file  . Transportation needs:    Medical: Not on file    Non-medical: Not on file  Tobacco Use  . Smoking status: Current Some Day Smoker  . Smokeless tobacco: Never Used  Substance and Sexual Activity  . Alcohol use: Yes  . Drug use: Never  . Sexual activity: Not on file  Lifestyle  . Physical activity:    Days per week: Not  on file    Minutes per session: Not on file  . Stress: Not on file  Relationships  . Social connections:    Talks on phone: Not on file    Gets together: Not on file    Attends religious service: Not on file    Active member of club or organization: Not on file    Attends meetings of clubs or organizations: Not on file    Relationship status: Not on file  Other Topics Concern  . Not on file  Social History Narrative  . Not on file   Additional Social History:    Allergies:  No Known Allergies  Labs:  Results for orders placed or performed during the  hospital encounter of 12/31/18 (from the past 48 hour(s))  Comprehensive metabolic panel     Status: Abnormal   Collection Time: 12/31/18  2:39 AM  Result Value Ref Range   Sodium 141 135 - 145 mmol/L   Potassium 3.9 3.5 - 5.1 mmol/L   Chloride 111 98 - 111 mmol/L   CO2 23 22 - 32 mmol/L   Glucose, Bld 115 (H) 70 - 99 mg/dL   BUN 14 6 - 20 mg/dL   Creatinine, Ser 1.61 0.61 - 1.24 mg/dL   Calcium 9.1 8.9 - 09.6 mg/dL   Total Protein 8.0 6.5 - 8.1 g/dL   Albumin 5.0 3.5 - 5.0 g/dL   AST 32 15 - 41 U/L   ALT 29 0 - 44 U/L   Alkaline Phosphatase 75 38 - 126 U/L   Total Bilirubin 0.5 0.3 - 1.2 mg/dL   GFR calc non Af Amer >60 >60 mL/min   GFR calc Af Amer >60 >60 mL/min   Anion gap 7 5 - 15    Comment: Performed at [facility], [address], [place], [place] [address]  Ethanol     Status: Abnormal   Collection Time: 12/31/18  2:39 AM  Result Value Ref Range   Alcohol, Ethyl (B) 247 (H) <10 mg/dL    Comment: (NOTE) Lowest detectable limit for serum alcohol is 10 mg/dL. For medical purposes only. Performed at [facility], [address] [place], [place] [address]   Salicylate level     Status: None   Collection Time: 12/31/18  2:39 AM  Result Value Ref Range   Salicylate Lvl <7.0 2.8 - 30.0 mg/dL    Comment: Performed at [facility], [address] [place], [place] [address]  Acetaminophen level     Status: Abnormal   Collection Time: 12/31/18  2:39 AM  Result Value Ref Range   Acetaminophen (Tylenol), Serum <10 (L) 10 - 30 ug/mL    Comment: (NOTE) Therapeutic concentrations vary significantly. A range of 10-30 ug/mL  may be an effective concentration for many patients. However, some  are best treated at concentrations outside of this range. Acetaminophen concentrations >150 ug/mL at 4 hours after ingestion  and >50 ug/mL at 12 hours after ingestion are often associated with  toxic reactions. Performed at [facility], [address] [place], [place] [address]   cbc     Status: None   Collection Time: 12/31/18  2:39 AM  Result Value Ref Range   WBC 8.5 4.0 - 10.5 K/uL   RBC 5.23 4.22 - 5.81 MIL/uL   Hemoglobin 16.9 13.0 - 17.0 g/dL   HCT 30.8 65.7 - 84.6 %   MCV 91.8 80.0 - 100.0 fL   MCH 32.3 26.0 - 34.0  pg   MCHC 35.2 30.0 - 36.0 g/dL   RDW 40.911.5 81.111.5 - 91.415.5 %   Platelets 321 150 - 400 K/uL   nRBC 0.0 0.0 - 0.2 %    Comment: Performed at [facility], [address] [place], [place] [address]  Urine Drug Screen, Qualitative     Status: Abnormal   Collection Time: 12/31/18  2:39 AM  Result Value Ref Range   Tricyclic, Ur Screen NONE DETECTED NONE DETECTED   Amphetamines, Ur Screen NONE DETECTED NONE DETECTED   MDMA (Ecstasy)Ur Screen NONE DETECTED NONE DETECTED   Cocaine Metabolite,Ur [place] NONE DETECTED NONE DETECTED   Opiate, Ur Screen NONE DETECTED NONE DETECTED   Phencyclidine (PCP) Ur S NONE DETECTED NONE DETECTED   Cannabinoid 50 Ng, Ur [place] POSITIVE (A) NONE DETECTED   Barbiturates, Ur Screen NONE DETECTED NONE DETECTED   Benzodiazepine, Ur Scrn NONE DETECTED NONE DETECTED   Methadone Scn, Ur NONE DETECTED NONE DETECTED    Comment: (NOTE) Tricyclics + metabolites, urine    Cutoff 1000 ng/mL Amphetamines + metabolites, urine  Cutoff 1000 ng/mL MDMA (Ecstasy), urine              Cutoff 500 ng/mL Cocaine Metabolite, urine          Cutoff 300 ng/mL Opiate + metabolites, urine        Cutoff 300 ng/mL Phencyclidine (PCP), urine         Cutoff 25 ng/mL Cannabinoid, urine                 Cutoff 50 ng/mL Barbiturates + metabolites, urine  Cutoff 200 ng/mL Benzodiazepine, urine              Cutoff 200 ng/mL Methadone, urine                   Cutoff 300 ng/mL The urine drug screen provides only a preliminary, unconfirmed analytical test result and should not be used for non-medical purposes. Clinical consideration and professional judgment should be applied to any positive drug screen  result due to possible interfering substances. A more specific alternate chemical method must be used in order to obtain a confirmed analytical result. Gas chromatography / mass spectrometry (GC/MS) is the preferred confirmat ory method. Performed at [facility], [address] [place], [place] [address]     Current Facility-Administered Medications  Medication Dose Route Frequency Provider Last Rate Last Dose  . nicotine (NICODERM CQ - dosed in mg/24 hours) patch 21 mg  21 mg Transdermal Once Irean HongSung, Jade J, MD   21 mg at 12/31/18 0350   Current Outpatient Medications  Medication Sig Dispense Refill  . hydrOXYzine (ATARAX/VISTARIL) 25 MG tablet Take 1 tablet (25 mg total) by mouth every 6 (six) hours as needed for anxiety or nausea. 15 tablet 0  . predniSONE (STERAPRED UNI-PAK 21 TAB) 10 MG (21) TBPK tablet As directed x 6 days 21 tablet 0  . promethazine (PHENERGAN) 25 MG suppository Place 1 suppository (25 mg total) rectally every 6 (six) hours as needed for nausea or vomiting. 12 each 0  . sulfamethoxazole-trimethoprim (BACTRIM DS) 800-160 MG tablet Take 1 tablet by mouth 2 (two) times daily. 20 tablet 0    Musculoskeletal: Strength & Muscle Tone: within normal limits Gait & Station: normal Patient leans: N/A  Psychiatric Specialty Exam: Physical Exam  ROS  Blood pressure 140/88, pulse (!) 115, temperature 97.7 F (36.5 C), temperature source Oral, resp. rate 18, height 6\' 1"  (  1.854 m), weight 127 kg, SpO2 98 %.Body mass index is 36.94 kg/m.  General Appearance: Disheveled  Eye Contact:  Fair  Speech:  Pressured  Volume:  Increased  Mood:  Anxious  Affect:  Depressed and Tearful  Thought Process:  Linear  Orientation:  Full (Time, Place, and Person)  Thought Content:  Illogical  Suicidal Thoughts:  No  Homicidal Thoughts:  No  Memory:  Negative Recent;   Fair  Judgement:  Impaired  Insight:  Lacking  Psychomotor Activity:  Normal  Concentration:   Concentration: Fair  Recall:  [employer] of Knowledge:  Fair  Language:  Good  Akathisia:  No  Handed:  Right  AIMS (if indicated):     Assets:  Social Support  ADL's:  Intact  Cognition:  WNL  Sleep:        Treatment Plan Summary: As discussed with Dr. Lucianne Muss the patient does not meet criteria for inpatient admission. Once collaboration with mom has been confirmed and noted. The patient needs resources for outpatient substance abuse therapy   Disposition: Patient does not meet criteria for psychiatric inpatient admission. Supportive therapy provided about ongoing stressors. Refer to IOP. Discussed crisis plan, support from social network, calling 911, coming to the Emergency Department, and calling Suicide Hotline.  Catalina Gravel, NP 12/31/2018 5:11 AM

## 2020-01-21 ENCOUNTER — Encounter (HOSPITAL_COMMUNITY): Payer: Self-pay | Admitting: Emergency Medicine

## 2020-01-21 ENCOUNTER — Emergency Department (HOSPITAL_COMMUNITY)
Admission: EM | Admit: 2020-01-21 | Discharge: 2020-01-21 | Disposition: A | Discharge status: Home / Self Care | Payer: 59 | Attending: Emergency Medicine | Admitting: Emergency Medicine

## 2020-01-21 ENCOUNTER — Other Ambulatory Visit: Payer: Self-pay

## 2020-01-21 ENCOUNTER — Emergency Department (HOSPITAL_COMMUNITY): Payer: 59

## 2020-01-21 DIAGNOSIS — N453 Epididymo-orchitis: Secondary | ICD-10-CM | POA: Insufficient documentation

## 2020-01-21 DIAGNOSIS — F909 Attention-deficit hyperactivity disorder, unspecified type: Secondary | ICD-10-CM | POA: Diagnosis not present

## 2020-01-21 DIAGNOSIS — N5082 Scrotal pain: Secondary | ICD-10-CM | POA: Diagnosis present

## 2020-01-21 DIAGNOSIS — Z720 Tobacco use: Secondary | ICD-10-CM | POA: Insufficient documentation

## 2020-01-21 LAB — URINALYSIS, ROUTINE W REFLEX MICROSCOPIC
Bilirubin Urine: NEGATIVE
Glucose, UA: NEGATIVE mg/dL
Hgb urine dipstick: NEGATIVE
Ketones, ur: NEGATIVE mg/dL
Nitrite: NEGATIVE
Protein, ur: 30 mg/dL — AB
Specific Gravity, Urine: 1.028 (ref 1.005–1.030)
pH: 6 (ref 5.0–8.0)

## 2020-01-21 LAB — HIV ANTIBODY (ROUTINE TESTING W REFLEX): HIV Screen 4th Generation wRfx: NONREACTIVE

## 2020-01-21 MED ORDER — STERILE WATER FOR INJECTION IJ SOLN
INTRAMUSCULAR | Status: AC
  Filled 2020-01-21: qty 10

## 2020-01-21 MED ORDER — DOXYCYCLINE HYCLATE 100 MG PO CAPS: 100.0000 mg | ORAL_CAPSULE | Freq: Two times a day (BID) | ORAL | 0 refills | Status: DC

## 2020-01-21 MED ORDER — IBUPROFEN 800 MG PO TABS
800.0000 mg | ORAL_TABLET | Freq: Once | ORAL | Status: AC
  Administered 2020-01-21: 800 mg via ORAL
  Filled 2020-01-21: qty 1

## 2020-01-21 MED ORDER — CEFTRIAXONE SODIUM 500 MG IJ SOLR
500.0000 mg | Freq: Once | INTRAMUSCULAR | Status: AC
  Administered 2020-01-21: 500 mg via INTRAMUSCULAR
  Filled 2020-01-21: qty 500

## 2020-01-21 NOTE — ED Notes (Signed)
Sterile water injection override to dilute/admin rochepin IM.

## 2020-01-21 NOTE — ED Provider Notes (Signed)
[place] EMERGENCY DEPARTMENT Provider Note   CSN: [id number] Arrival date & time: 01/21/20  5188     History Chief Complaint  Patient presents with  . Testicle Pain    Jerry Lawrence is a 24 y.o. male.  Patient presents with c/o scrotal swelling and pain starting yesterday. No injury. Pt is sexually active, uses protection, last encounter several months ago. No urethral discharge noted. He noted a lesion to the tip of the penis recently. No fever, sore throat, irritative urinary symptoms. The onset of this condition was acute. The course is constant. Aggravating factors: none. Alleviating factors: none.          Past Medical History:  Diagnosis Date  . History of attention deficit hyperactivity disorder (ADHD)     Patient Active Problem List   Diagnosis Date Noted  . Alcohol intoxication delirium with moderate or severe use disorder ([place]) 12/31/2018  . History of ADHD 09/28/2015    Past Surgical History:  Procedure Laterality Date  . FINGER SURGERY  06/2011   left hand 5th finger       No family history on file.  Social History   Tobacco Use  . Smoking status: Current Some Day Smoker  . Smokeless tobacco: Never Used  Substance Use Topics  . Alcohol use: Yes  . Drug use: Never    Home Medications Prior to Admission medications   Medication Sig Start Date End Date Taking? Authorizing Provider  doxycycline (VIBRAMYCIN) 100 MG capsule Take 1 capsule (100 mg total) by mouth 2 (two) times daily. 01/21/20   Carlisle Cater, PA-C    Allergies    Patient has no known allergies.  Review of Systems   Review of Systems  Constitutional: Negative for fever.  HENT: Negative for sore throat.   Eyes: Negative for discharge.  Gastrointestinal: Negative for rectal pain.  Genitourinary: Positive for genital sores and scrotal swelling. Negative for discharge, dysuria, frequency, penile pain and testicular pain.  Musculoskeletal: Negative for  arthralgias.  Skin: Negative for rash.  Hematological: Negative for adenopathy.    Physical Exam Updated Vital Signs BP 120/71   Pulse 88   Temp 98.2 F (36.8 C) (Oral)   Resp 16   SpO2 98%   Physical Exam Vitals and nursing note reviewed. Exam conducted with a chaperone present.  Constitutional:      Appearance: He is well-developed.  HENT:     Head: Normocephalic and atraumatic.  Eyes:     General:        Right eye: No discharge.        Left eye: No discharge.     Conjunctiva/sclera: Conjunctivae normal.  Cardiovascular:     Rate and Rhythm: Normal rate and regular rhythm.     Heart sounds: Normal heart sounds.  Pulmonary:     Effort: Pulmonary effort is normal.     Breath sounds: Normal breath sounds.  Abdominal:     Palpations: Abdomen is soft.     Tenderness: There is no abdominal tenderness.  Genitourinary:    Penis: Normal.      Testes:        Right: Mass, tenderness or swelling not present.        Left: Tenderness and swelling present. Mass not present.     Epididymis:     Right: No tenderness.     Left: Tenderness present.  Musculoskeletal:     Cervical back: Normal range of motion and neck supple.  Skin:  General: Skin is warm and dry.  Neurological:     Mental Status: He is alert.     ED Results / Procedures / Treatments   Labs (all labs ordered are listed, but only abnormal results are displayed) Labs Reviewed  URINALYSIS, ROUTINE W REFLEX MICROSCOPIC - Abnormal; Notable for the following components:      Result Value   APPearance HAZY (*)    Protein, ur 30 (*)    Leukocytes,Ua SMALL (*)    Bacteria, UA RARE (*)    All other components within normal limits  HIV ANTIBODY (ROUTINE TESTING W REFLEX)  RPR  GC/CHLAMYDIA PROBE AMP ([place]) NOT AT [facility]    EKG None  Radiology US SCROTUM W/DOPPLER  Result Date: 01/21/2020 CLINICAL DATA:  24 year old male with left testicular pain and swelling x1 day. EXAM: SCROTAL ULTRASOUND DOPPLER  ULTRASOUND OF THE TESTICLES TECHNIQUE: Complete ultrasound examination of the testicles, epididymis, and other scrotal structures was performed. Color and spectral Doppler ultrasound were also utilized to evaluate blood flow to the testicles. COMPARISON:  None. FINDINGS: Right testicle Measurements: 5.0 x 3.1 x 3.3 cm. No mass or microlithiasis visualized. Left testicle Measurements: 5.0 x 3.0 x 3.9 cm. No mass or microlithiasis visualized. Mild hyperemia. Right epididymis: Normal in size and appearance. There is a 1.5 cm epididymal head cyst. Left epididymis:  Mildly enlarged epididymis. Hydrocele:  None visualized. Varicocele:  None visualized. Pulsed Doppler interrogation of both testes demonstrates normal low resistance arterial and venous waveforms bilaterally. There is diffuse scrotal wall swelling. IMPRESSION: Diffuse scrotal wall swelling and edema may represent cellulitis. There is however apparent mild hyperemia of the left testicle and epididymis which may represent epididymo-orchitis. Clinical correlation is recommended. No drainable fluid collection or abscess. Electronically Signed   By: Elgie Collard M.D.   On: 01/21/2020 20:38    Procedures Procedures (including critical care time)  Medications Ordered in ED Medications  cefTRIAXone (ROCEPHIN) injection 500 mg (has no administration in time range)  ibuprofen (ADVIL) tablet 800 mg (has no administration in time range)    ED Course  I have reviewed the triage vital signs and the nursing notes.  Pertinent labs & imaging results that were available during my care of the patient were reviewed by me and considered in my medical decision making (see chart for details).  Patient seen and examined. [place] ordered. Pt agrees to GC/chlamydia testing, HIV, RPR.   Vital signs reviewed and are as follows: BP 120/71   Pulse 88   Temp 98.2 F (36.8 C) (Oral)   Resp 16   SpO2 98%   9:21 PM patient updated on ultrasound results. Discussed  symptomatic treatment for epididymal orchitis. Will give IM Rocephin and discharged home with a weeks worth of doxycycline. Encouraged PCP follow-up in 1 week if symptoms are not improved.     MDM Rules/Calculators/A&P                      Patient with left-sided testicular swelling and tenderness. Ultrasound does not show torsion. Clinical concern for epididymoorchitis.   Final Clinical Impression(s) / ED Diagnoses Final diagnoses:  Epididymo-orchitis    Rx / DC Orders ED Discharge Orders         Ordered    doxycycline (VIBRAMYCIN) 100 MG capsule  2 times daily     01/21/20 2109           Renne Crigler, PA-C 01/21/20 2122    Lorre Nick, MD 01/21/20 2242

## 2020-01-21 NOTE — Discharge Instructions (Signed)
Please read and follow all provided instructions.  Your diagnoses today include:  1. Epididymo-orchitis     Tests performed today include: Test for gonorrhea and chlamydia.  Ultrasound - shows normal blood flow to the testicle  Test for HIV and syphilis.  You will be notified by telephone with any positive results.  Vital signs. See below for your results today.   Home care instructions:  Read educational materials contained in this packet and follow any instructions provided.   You should tell your partners about your infection and avoid having sex for one week to allow time for the medicine to work.  Sexually transmitted disease testing also available at:  [facility] [place], [facility] [address], [place], phone [phone] or [phone]   Monday - Friday, call for an appointment  Return instructions:  Please return to the Emergency Department if you experience worsening symptoms.  Please return if you have any other emergent concerns.  Additional Information:  Your vital signs today were: BP 120/71   Pulse 88   Temp 98.2 F (36.8 C) (Oral)   Resp 16   SpO2 98%  If your blood pressure (BP) was elevated above 135/85 this visit, please have this repeated by your doctor within one month. --------------

## 2020-01-21 NOTE — ED Triage Notes (Signed)
Pt endorses testicle pain and swelling since yesterday.

## 2020-01-22 LAB — RPR: RPR Ser Ql: NONREACTIVE

## 2020-03-02 ENCOUNTER — Other Ambulatory Visit: Payer: Self-pay

## 2020-03-02 ENCOUNTER — Emergency Department
Admission: EM | Admit: 2020-03-02 | Discharge: 2020-03-03 | Disposition: A | Discharge status: Home / Self Care | Payer: 59 | Attending: Emergency Medicine | Admitting: Emergency Medicine

## 2020-03-02 ENCOUNTER — Emergency Department: Payer: 59

## 2020-03-02 DIAGNOSIS — W01198A Fall on same level from slipping, tripping and stumbling with subsequent striking against other object, initial encounter: Secondary | ICD-10-CM | POA: Diagnosis not present

## 2020-03-02 DIAGNOSIS — S0003XA Contusion of scalp, initial encounter: Secondary | ICD-10-CM | POA: Diagnosis not present

## 2020-03-02 DIAGNOSIS — Y929 Unspecified place or not applicable: Secondary | ICD-10-CM | POA: Insufficient documentation

## 2020-03-02 DIAGNOSIS — F909 Attention-deficit hyperactivity disorder, unspecified type: Secondary | ICD-10-CM | POA: Insufficient documentation

## 2020-03-02 DIAGNOSIS — F10929 Alcohol use, unspecified with intoxication, unspecified: Secondary | ICD-10-CM | POA: Diagnosis not present

## 2020-03-02 DIAGNOSIS — F17210 Nicotine dependence, cigarettes, uncomplicated: Secondary | ICD-10-CM | POA: Diagnosis not present

## 2020-03-02 DIAGNOSIS — Y999 Unspecified external cause status: Secondary | ICD-10-CM | POA: Diagnosis not present

## 2020-03-02 DIAGNOSIS — R55 Syncope and collapse: Secondary | ICD-10-CM | POA: Insufficient documentation

## 2020-03-02 DIAGNOSIS — R569 Unspecified convulsions: Secondary | ICD-10-CM | POA: Diagnosis present

## 2020-03-02 DIAGNOSIS — Y939 Activity, unspecified: Secondary | ICD-10-CM | POA: Insufficient documentation

## 2020-03-02 MED ORDER — SODIUM CHLORIDE 0.9 % IV BOLUS
1000.0000 mL | Freq: Once | INTRAVENOUS | Status: AC
  Administered 2020-03-03: 1000 mL via INTRAVENOUS

## 2020-03-02 NOTE — ED Notes (Signed)
Pt reports having 4 drinks at the bar

## 2020-03-02 NOTE — ED Notes (Signed)
Seizure pads in place  lt green, purple, red top sent to lab

## 2020-03-02 NOTE — ED Triage Notes (Signed)
Pt arrives from [facility] via [facility] with complaint of seizure and fall. Pt was sitting on a barstool when seizure started and fell, resulting in hematoma to left posterior head  Pt reports no history of seizures. Unknown seizure duration  Pt oriented to self, place, and situation, slightly disoriented to time

## 2020-03-03 LAB — CBC WITH DIFFERENTIAL/PLATELET
Abs Immature Granulocytes: 0.06 10*3/uL (ref 0.00–0.07)
Basophils Absolute: 0.1 10*3/uL (ref 0.0–0.1)
Basophils Relative: 1 %
Eosinophils Absolute: 0 10*3/uL (ref 0.0–0.5)
Eosinophils Relative: 0 %
HCT: 47.1 % (ref 39.0–52.0)
Hemoglobin: 16.4 g/dL (ref 13.0–17.0)
Immature Granulocytes: 1 %
Lymphocytes Relative: 21 %
Lymphs Abs: 2.3 10*3/uL (ref 0.7–4.0)
MCH: 32.3 pg (ref 26.0–34.0)
MCHC: 34.8 g/dL (ref 30.0–36.0)
MCV: 92.9 fL (ref 80.0–100.0)
Monocytes Absolute: 0.8 10*3/uL (ref 0.1–1.0)
Monocytes Relative: 8 %
Neutro Abs: 7.7 10*3/uL (ref 1.7–7.7)
Neutrophils Relative %: 69 %
Platelets: 357 10*3/uL (ref 150–400)
RBC: 5.07 MIL/uL (ref 4.22–5.81)
RDW: 12.6 % (ref 11.5–15.5)
WBC: 11 10*3/uL — ABNORMAL HIGH (ref 4.0–10.5)
nRBC: 0 % (ref 0.0–0.2)

## 2020-03-03 LAB — URINALYSIS, ROUTINE W REFLEX MICROSCOPIC
Bacteria, UA: NONE SEEN
Bilirubin Urine: NEGATIVE
Glucose, UA: NEGATIVE mg/dL
Ketones, ur: NEGATIVE mg/dL
Leukocytes,Ua: NEGATIVE
Nitrite: NEGATIVE
Protein, ur: 30 mg/dL — AB
Specific Gravity, Urine: 1.012 (ref 1.005–1.030)
Squamous Epithelial / HPF: NONE SEEN (ref 0–5)
pH: 6 (ref 5.0–8.0)

## 2020-03-03 LAB — COMPREHENSIVE METABOLIC PANEL
ALT: 40 U/L (ref 0–44)
AST: 46 U/L — ABNORMAL HIGH (ref 15–41)
Albumin: 4.6 g/dL (ref 3.5–5.0)
Alkaline Phosphatase: 81 U/L (ref 38–126)
Anion gap: 19 — ABNORMAL HIGH (ref 5–15)
BUN: 11 mg/dL (ref 6–20)
CO2: 14 mmol/L — ABNORMAL LOW (ref 22–32)
Calcium: 9.1 mg/dL (ref 8.9–10.3)
Chloride: 102 mmol/L (ref 98–111)
Creatinine, Ser: 1.09 mg/dL (ref 0.61–1.24)
GFR calc Af Amer: >60 mL/min (ref >60)
GFR calc non Af Amer: >60 mL/min (ref >60)
Glucose, Bld: 144 mg/dL — ABNORMAL HIGH (ref 70–99)
Potassium: 3.4 mmol/L — ABNORMAL LOW (ref 3.5–5.1)
Sodium: 135 mmol/L (ref 135–145)
Total Bilirubin: 1.1 mg/dL (ref 0.3–1.2)
Total Protein: 8 g/dL (ref 6.5–8.1)

## 2020-03-03 LAB — BASIC METABOLIC PANEL
Anion gap: 8 (ref 5–15)
BUN: 8 mg/dL (ref 6–20)
CO2: 21 mmol/L — ABNORMAL LOW (ref 22–32)
Calcium: 8 mg/dL — ABNORMAL LOW (ref 8.9–10.3)
Chloride: 109 mmol/L (ref 98–111)
Creatinine, Ser: 0.75 mg/dL (ref 0.61–1.24)
GFR calc Af Amer: >60 mL/min (ref >60)
GFR calc non Af Amer: >60 mL/min (ref >60)
Glucose, Bld: 107 mg/dL — ABNORMAL HIGH (ref 70–99)
Potassium: 3.1 mmol/L — ABNORMAL LOW (ref 3.5–5.1)
Sodium: 138 mmol/L (ref 135–145)

## 2020-03-03 LAB — URINE DRUG SCREEN, QUALITATIVE (ARMC ONLY)
Amphetamines, Ur Screen: NOT DETECTED
Barbiturates, Ur Screen: NOT DETECTED
Benzodiazepine, Ur Scrn: NOT DETECTED
Cannabinoid 50 Ng, Ur ~~LOC~~: POSITIVE — AB
Cocaine Metabolite,Ur ~~LOC~~: NOT DETECTED
MDMA (Ecstasy)Ur Screen: NOT DETECTED
Methadone Scn, Ur: NOT DETECTED
Opiate, Ur Screen: NOT DETECTED
Phencyclidine (PCP) Ur S: NOT DETECTED
Tricyclic, Ur Screen: NOT DETECTED

## 2020-03-03 LAB — MAGNESIUM: Magnesium: 2.3 mg/dL (ref 1.7–2.4)

## 2020-03-03 LAB — ETHANOL: Alcohol, Ethyl (B): <10 mg/dL (ref <10)

## 2020-03-03 LAB — LACTIC ACID, PLASMA
Lactic Acid, Venous: 1 mmol/L (ref 0.5–1.9)
Lactic Acid, Venous: 3 mmol/L (ref 0.5–1.9)

## 2020-03-03 MED ORDER — THIAMINE HCL 100 MG/ML IJ SOLN
100.0000 mg | Freq: Once | INTRAMUSCULAR | Status: AC
  Administered 2020-03-03: 100 mg via INTRAVENOUS
  Filled 2020-03-03: qty 2

## 2020-03-03 MED ORDER — THIAMINE HCL 100 MG/ML IJ SOLN: 100.0000 mg | Freq: Every day | INTRAMUSCULAR | Status: DC

## 2020-03-03 MED ORDER — SODIUM CHLORIDE 0.9 % IV BOLUS
1000.0000 mL | Freq: Once | INTRAVENOUS | Status: AC
  Administered 2020-03-03: 1000 mL via INTRAVENOUS

## 2020-03-03 MED ORDER — POTASSIUM CHLORIDE CRYS ER 20 MEQ PO TBCR
40.0000 meq | EXTENDED_RELEASE_TABLET | Freq: Once | ORAL | Status: AC
  Administered 2020-03-03: 40 meq via ORAL
  Filled 2020-03-03: qty 2

## 2020-03-03 NOTE — ED Notes (Signed)
Pt given blanket as requested 

## 2020-03-03 NOTE — Discharge Instructions (Signed)
You have been seen in the emergency department today for a possible seizure.  Your workup today including labs are within normal limits after you received some IV fluids.  Please follow up with your doctor as soon as possible regarding today's emergency department visit and your possible seizure.  Although it may be unlikely that you had a true epileptic or epileptiform seizure, it is important that you follow-up as an outpatient.  You will also need to follow up with a neurologist as soon as possible, please call for appointment.  As we have discussed it is very important that you DO NOT drive until you have been seen and cleared by your neurologist.  Please drink plenty of fluids, get plenty of sleep and avoid any alcohol or drug use.  At a minimum, consider drastically cutting back on the amount of alcohol you are consuming.  Try to get plenty of sleep as well, as sleep deprivation can lead to seizure activity in people that are prone to seizures.  Return to the emergency department if you have any further seizures, develop any weakness/numbness of any arm/leg, confusion, slurred speech, or sudden/severe headache.

## 2020-03-03 NOTE — ED Provider Notes (Signed)
[facility] Emergency Department Provider Note  ____________________________________________   First MD Initiated Contact with Patient 03/02/20 2352     (approximate)  I have reviewed the triage vital signs and the nursing notes.   HISTORY  Chief Complaint Seizures    HPI Jerry Lawrence is a 24 y.o. male with medical history as listed below who presents by EMS after possible seizure.  He was at a bar and had a few drinks over the space of a few hours.  He and EMS report that he then felt warm all over and then fell forward and hit the bar and then fell backwards onto the ground striking the top left part of his head on the floor.  He was reportedly confused (possibly post ictal) after the fall.  He regained consciousness relatively quickly but his friends at the bar reported that he was shaking and foaming at the mouth a little bit.  He reports no injuries except that his head is sore where he hit the ground.  He denies neck pain, chest pain, shortness of breath, fever, cough, nausea, vomiting, abdominal pain, and dysuria.  He has never had seizures before.  He has no numbness nor weakness in his extremities.  The onset was acute and the episode was severe but he is back to baseline.  His mother is at bedside and confirms he is acting like himself although earlier when he was on the phone with her he sounded confused.  Of note, his mother reports and the patient confirms that 24 hours ago he was on a drinking binge and came home last night (again about 24 hours ago) heavily intoxicated, "more drunk than I have ever seen him".  He acknowledges that his drinking may have become an issue.         Past Medical History:  Diagnosis Date  . History of attention deficit hyperactivity disorder (ADHD)     Patient Active Problem List   Diagnosis Date Noted  . Alcohol intoxication delirium with moderate or severe use disorder (HCC) 12/31/2018  . History of ADHD  09/28/2015    Past Surgical History:  Procedure Laterality Date  . FINGER SURGERY  06/2011   left hand 5th finger    Prior to Admission medications   Medication Sig Start Date End Date Taking? Authorizing Provider  acetaminophen (TYLENOL) 500 MG tablet Take 1,000 mg by mouth every 6 (six) hours as needed for headache.   Yes [provider]    Allergies Patient has no known allergies.  History reviewed. No pertinent family history.  Social History Social History   Tobacco Use  . Smoking status: Current Some Day Smoker  . Smokeless tobacco: Never Used  Substance Use Topics  . Alcohol use: Yes  . Drug use: Never    Review of Systems Constitutional: No fever/chills Eyes: No visual changes. ENT: No sore throat. Cardiovascular: Denies chest pain. Respiratory: Denies shortness of breath. Gastrointestinal: No abdominal pain.  No nausea, no vomiting.  No diarrhea.  No constipation. Genitourinary: Negative for dysuria. Musculoskeletal: Negative for neck pain.  Negative for back pain. Integumentary: Negative for rash. Neurological: Possible seizure-like activity.  Mild headache after fall.  No focal weakness or numbness.   ____________________________________________   PHYSICAL EXAM:  VITAL SIGNS: ED Triage Vitals  Enc Vitals Group     BP 03/02/20 2322 (!) 135/53     Pulse Rate 03/02/20 2322 (!) 123     Resp 03/02/20 2322 (!) 21  Temp 03/02/20 2322 98.3 F (36.8 C)     Temp Source 03/02/20 2322 Oral     SpO2 03/02/20 2322 100 %     Weight 03/02/20 2323 86.2 kg (190 lb)     Height 03/02/20 2323 1.803 m (5\' 11" )     Head Circumference --      Peak Flow --      Pain Score 03/02/20 2323 0     Pain Loc --      Pain Edu? --      Excl. in [place]? --     Constitutional: Alert and oriented.  Generally well-appearing and in no distress at this time. Eyes: Conjunctivae are normal.  Pupils are equal and reactive bilaterally. Head: Patient has a contusion and  hematoma to the top left crown of his head that is tender but with no laceration. Nose: No congestion/rhinnorhea. Mouth/Throat: Patient is wearing a mask. Neck: No stridor.  No meningeal signs.   Cardiovascular: Tachycardia, regular rhythm. Good peripheral circulation. Grossly normal heart sounds. Respiratory: Normal respiratory effort.  No retractions. Gastrointestinal: Soft and nontender. No distention.  Musculoskeletal: No lower extremity tenderness nor edema. No gross deformities of extremities. Neurologic:  Normal speech and language. No gross focal neurologic deficits are appreciated.  Skin:  Skin is warm, dry and intact. Psychiatric: Mood and affect are normal. Speech and behavior are normal.  ____________________________________________   LABS (all labs ordered are listed, but only abnormal results are displayed)  Labs Reviewed  CBC WITH DIFFERENTIAL/PLATELET - Abnormal; Notable for the following components:      Result Value   WBC 11.0 (*)    All other components within normal limits  COMPREHENSIVE METABOLIC PANEL - Abnormal; Notable for the following components:   Potassium 3.4 (*)    CO2 14 (*)    Glucose, Bld 144 (*)    AST 46 (*)    Anion gap 19 (*)    All other components within normal limits  URINE DRUG SCREEN, QUALITATIVE ([facility] ONLY) - Abnormal; Notable for the following components:   Cannabinoid 50 Ng, Ur [place] POSITIVE (*)    All other components within normal limits  URINALYSIS, ROUTINE W REFLEX MICROSCOPIC - Abnormal; Notable for the following components:   Color, Urine STRAW (*)    APPearance CLEAR (*)    Hgb urine dipstick MODERATE (*)    Protein, ur 30 (*)    All other components within normal limits  LACTIC ACID, PLASMA - Abnormal; Notable for the following components:   Lactic Acid, Venous 3.0 (*)    All other components within normal limits  BASIC METABOLIC PANEL - Abnormal; Notable for the following components:   Potassium 3.1 (*)    CO2 21 (*)     Glucose, Bld 107 (*)    Calcium 8.0 (*)    All other components within normal limits  ETHANOL  MAGNESIUM  LACTIC ACID, PLASMA   ____________________________________________  EKG  ED ECG REPORT I, Hinda Kehr, the attending physician, personally viewed and interpreted this ECG.  Date: 03/02/2020 EKG Time: 23: 26 Rate: 127 Rhythm: Sinus tachycardia QRS Axis: Right axis deviation Intervals: normal ST/T Wave abnormalities: Non-specific ST segment / T-wave changes, but no clear evidence of acute ischemia. Narrative Interpretation: no definitive evidence of acute ischemia; does not meet STEMI criteria.   ____________________________________________  RADIOLOGY I, Hinda Kehr, personally viewed and evaluated these images (plain radiographs) as part of my medical decision making, as well as reviewing the written report by the  radiologist.  ED MD interpretation: No acute abnormalities identified on head CT nor cervical spine CT.  Official radiology report(s): CT Head Wo Contrast  Result Date: 03/03/2020 CLINICAL DATA:  Seizure, fell, left posterior scalp swelling EXAM: CT HEAD WITHOUT CONTRAST TECHNIQUE: Contiguous axial images were obtained from the base of the skull through the vertex without intravenous contrast. COMPARISON:  03/18/2018 FINDINGS: Brain: No acute infarct or hemorrhage. Lateral ventricles and midline structures are unremarkable. No acute extra-axial fluid collections. No mass effect. Vascular: No hyperdense vessel or unexpected calcification. Skull: Normal. Negative for fracture or focal lesion. Sinuses/Orbits: No acute finding. Other: None. IMPRESSION: 1. Stable head CT, no acute process. Electronically Signed   By: Sharlet Salina M.D.   On: 03/03/2020 00:18   CT Cervical Spine Wo Contrast  Result Date: 03/03/2020 CLINICAL DATA:  Seizure, fell EXAM: CT CERVICAL SPINE WITHOUT CONTRAST TECHNIQUE: Multidetector CT imaging of the cervical spine was performed without  intravenous contrast. Multiplanar CT image reconstructions were also generated. COMPARISON:  03/18/2018 FINDINGS: Alignment: There is slight straightening of the cervical spine. Otherwise alignment is anatomic. Skull base and vertebrae: No acute displaced fracture. Soft tissues and spinal canal: No prevertebral fluid or swelling. No visible canal hematoma. Disc levels:  No significant spondylosis or facet hypertrophy. Upper chest: Airway is patent.  Lung apices are clear. Other: Reconstructed images demonstrate no additional findings. IMPRESSION: 1. No acute cervical spine fracture. Electronically Signed   By: Sharlet Salina M.D.   On: 03/03/2020 00:19    ____________________________________________   PROCEDURES   Procedure(s) performed (including Critical Care):  .1-3 Lead EKG Interpretation Performed by: Loleta Rose, MD Authorized by: Loleta Rose, MD     Interpretation: abnormal     ECG rate:  117   ECG rate assessment: tachycardic     Rhythm: sinus tachycardia     Ectopy: none     Conduction: normal       ____________________________________________   INITIAL IMPRESSION / MDM / ASSESSMENT AND PLAN / ED COURSE  As part of my medical decision making, I reviewed the following data within the electronic MEDICAL RECORD NUMBER History obtained from family, Nursing notes reviewed and incorporated, Labs reviewed , EKG interpreted , Old chart reviewed, Notes from prior ED visits and [place] Controlled Substance Database   Differential diagnosis includes, but is not limited to, vasovagal episode leading to seizure-like activity, true epileptic seizure, alcohol withdrawal seizure, electrolyte or metabolic abnormality, intracranial neoplasm, intracranial bleeding.  The patient acknowledges that he is starting to have an issue with alcohol but since he was actively drinking and had been drinking last night I strongly doubt that this was an alcohol withdrawal seizure.  He has back to his baseline.   He was apparently postictal before relatively short period of time and is unclear if the confusion was due to the head injury and/or alcohol intoxication.  He is currently tachycardic and I suspect that he is volume depleted after all the recent drinking and he is getting normal saline 1 g IV.  I had my usual customary new onset seizure discussion with the mother and the patient.  I will follow up on imaging and lab work and reassess but I do not anticipate the need for admission or antiepileptic drugs at this time.  The patient is on the cardiac monitor to evaluate for evidence of arrhythmia and/or significant heart rate changes.       Clinical Course as of Mar 03 321  Fri Mar 03, 2020  2836 Patient has been sleeping and feels well.  He and his mother are comfortable with the plan for discharge home.  I had my usual and customary first-time possible seizure discussion including recommendations against driving until cleared by her neurologist or at least until following up with his regular doctor.  However explained that I think it is unlikely that this is truly an epileptiform seizure.  I encouraged him to cut back on his alcohol consumption and follow-up with his regular doctor and I also provided information for follow-up with Dr. Malvin Johns with neurology.  I gave a dose of potassium 40 mEq by mouth; his repeat lactic acid normalized and his anion gap closed but his potassium has washed out a little bit to 3.1.  He and his mother, who is an ER nurse, understand and agree with the plan.  I gave my usual and customary return precautions.   [CF]    Clinical Course User Index [CF] Loleta Rose, MD     ____________________________________________  FINAL CLINICAL IMPRESSION(S) / ED DIAGNOSES  Final diagnoses:  Seizure-like activity (HCC)  Syncope and collapse  Alcoholic intoxication with complication (HCC)  Contusion of scalp, initial encounter     MEDICATIONS GIVEN DURING THIS  VISIT:  Medications  potassium chloride SA (KLOR-CON) CR tablet 40 mEq (has no administration in time range)  sodium chloride 0.9 % bolus 1,000 mL (0 mLs Intravenous Stopped 03/03/20 0118)  sodium chloride 0.9 % bolus 1,000 mL (0 mLs Intravenous Stopped 03/03/20 0232)  thiamine (B-1) injection 100 mg (100 mg Intravenous Given 03/03/20 0115)     ED Discharge Orders    None      *Please note:  Jerry Lawrence was evaluated in Emergency Department on 03/03/2020 for the symptoms described in the history of present illness. He was evaluated in the context of the global COVID-19 pandemic, which necessitated consideration that the patient might be at risk for infection with the SARS-CoV-2 virus that causes COVID-19. Institutional protocols and algorithms that pertain to the evaluation of patients at risk for COVID-19 are in a state of rapid change based on information released by regulatory bodies including the CDC and federal and state organizations. These policies and algorithms were followed during the patient's care in the ED.  Some ED evaluations and interventions may be delayed as a result of limited staffing during the pandemic.*  Note:  This document was prepared using Dragon voice recognition software and may include unintentional dictation errors.   Loleta Rose, MD 03/03/20 [phone]

## 2020-12-23 ENCOUNTER — Other Ambulatory Visit: Payer: Self-pay

## 2020-12-23 ENCOUNTER — Emergency Department
Admission: EM | Admit: 2020-12-23 | Discharge: 2020-12-23 | Disposition: A | Discharge status: Home / Self Care | Payer: 59 | Attending: Emergency Medicine | Admitting: Emergency Medicine

## 2020-12-23 ENCOUNTER — Encounter: Payer: Self-pay | Admitting: Emergency Medicine

## 2020-12-23 DIAGNOSIS — R456 Violent behavior: Secondary | ICD-10-CM | POA: Diagnosis not present

## 2020-12-23 DIAGNOSIS — R45851 Suicidal ideations: Secondary | ICD-10-CM | POA: Diagnosis not present

## 2020-12-23 DIAGNOSIS — Y907 Blood alcohol level of 200-239 mg/100 ml: Secondary | ICD-10-CM | POA: Diagnosis not present

## 2020-12-23 DIAGNOSIS — F10188 Alcohol abuse with other alcohol-induced disorder: Secondary | ICD-10-CM | POA: Diagnosis not present

## 2020-12-23 DIAGNOSIS — Z20822 Contact with and (suspected) exposure to covid-19: Secondary | ICD-10-CM | POA: Diagnosis not present

## 2020-12-23 DIAGNOSIS — R4689 Other symptoms and signs involving appearance and behavior: Secondary | ICD-10-CM

## 2020-12-23 DIAGNOSIS — Z23 Encounter for immunization: Secondary | ICD-10-CM | POA: Insufficient documentation

## 2020-12-23 DIAGNOSIS — F10920 Alcohol use, unspecified with intoxication, uncomplicated: Secondary | ICD-10-CM

## 2020-12-23 LAB — COMPREHENSIVE METABOLIC PANEL
ALT: 28 U/L (ref 0–44)
AST: 31 U/L (ref 15–41)
Albumin: 4.8 g/dL (ref 3.5–5.0)
Alkaline Phosphatase: 75 U/L (ref 38–126)
Anion gap: 10 (ref 5–15)
BUN: 15 mg/dL (ref 6–20)
CO2: 21 mmol/L — ABNORMAL LOW (ref 22–32)
Calcium: 8.9 mg/dL (ref 8.9–10.3)
Chloride: 108 mmol/L (ref 98–111)
Creatinine, Ser: 0.81 mg/dL (ref 0.61–1.24)
GFR, Estimated: >60 mL/min (ref >60)
Glucose, Bld: 106 mg/dL — ABNORMAL HIGH (ref 70–99)
Potassium: 3.8 mmol/L (ref 3.5–5.1)
Sodium: 139 mmol/L (ref 135–145)
Total Bilirubin: 0.6 mg/dL (ref 0.3–1.2)
Total Protein: 8 g/dL (ref 6.5–8.1)

## 2020-12-23 LAB — CBC
HCT: 47.3 % (ref 39.0–52.0)
Hemoglobin: 16.5 g/dL (ref 13.0–17.0)
MCH: 31.7 pg (ref 26.0–34.0)
MCHC: 34.9 g/dL (ref 30.0–36.0)
MCV: 90.8 fL (ref 80.0–100.0)
Platelets: 296 10*3/uL (ref 150–400)
RBC: 5.21 MIL/uL (ref 4.22–5.81)
RDW: 12.3 % (ref 11.5–15.5)
WBC: 6.3 10*3/uL (ref 4.0–10.5)
nRBC: 0 % (ref 0.0–0.2)

## 2020-12-23 LAB — URINE DRUG SCREEN, QUALITATIVE (ARMC ONLY)
Amphetamines, Ur Screen: NOT DETECTED
Barbiturates, Ur Screen: NOT DETECTED
Benzodiazepine, Ur Scrn: NOT DETECTED
Cannabinoid 50 Ng, Ur ~~LOC~~: NOT DETECTED
Cocaine Metabolite,Ur ~~LOC~~: NOT DETECTED
MDMA (Ecstasy)Ur Screen: NOT DETECTED
Methadone Scn, Ur: NOT DETECTED
Opiate, Ur Screen: NOT DETECTED
Phencyclidine (PCP) Ur S: NOT DETECTED
Tricyclic, Ur Screen: NOT DETECTED

## 2020-12-23 LAB — ETHANOL: Alcohol, Ethyl (B): 217 mg/dL — ABNORMAL HIGH (ref <10)

## 2020-12-23 LAB — RESP PANEL BY RT-PCR (FLU A&B, COVID) ARPGX2
Influenza A by PCR: NEGATIVE
Influenza B by PCR: NEGATIVE
SARS Coronavirus 2 by RT PCR: NEGATIVE

## 2020-12-23 LAB — SALICYLATE LEVEL: Salicylate Lvl: <7 mg/dL — ABNORMAL LOW (ref 7.0–30.0)

## 2020-12-23 LAB — ACETAMINOPHEN LEVEL: Acetaminophen (Tylenol), Serum: <10 ug/mL — ABNORMAL LOW (ref 10–30)

## 2020-12-23 MED ORDER — TETANUS-DIPHTH-ACELL PERTUSSIS 5-2.5-18.5 LF-MCG/0.5 IM SUSY
0.5000 mL | PREFILLED_SYRINGE | Freq: Once | INTRAMUSCULAR | Status: AC
  Administered 2020-12-23: 0.5 mL via INTRAMUSCULAR
  Filled 2020-12-23: qty 0.5

## 2020-12-23 NOTE — ED Notes (Signed)
Meal tray given 

## 2020-12-23 NOTE — ED Notes (Addendum)
Pt left thumb based dressed (appears bleeding again), EDP aware, pt allowed to use phone to call mother and father  [phone] (mother), [phone]  Pt given water as requested denies food or blanket or tv remote

## 2020-12-23 NOTE — Discharge Instructions (Addendum)
Please seek medical attention and help for any thoughts about wanting to harm yourself, harm others, any concerning change in behavior, severe depression, inappropriate drug use or any other new or concerning symptoms. ° °

## 2020-12-23 NOTE — BH Assessment (Signed)
Writer received phone call from patient's mother. Patient have been sober for a period of time and doing well. However, last night (12/22/2020) he was drinking with his girlfriend and their friends. He drank more than he usually does and it resulted in him blacking out and becoming aggressive. 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 He was sentenced for 30 days in jail from a past DWI. He is going to serve them by doing consecutive weekends until the 30 days are completed.

## 2020-12-23 NOTE — ED Notes (Signed)
Pt discharged home.  VS stable. Belongings returned to patient. Discharge instructions and IOP information given to patient.

## 2020-12-23 NOTE — ED Notes (Signed)
IVC, SOC called for consult

## 2020-12-23 NOTE — ED Notes (Signed)
Pt given water 

## 2020-12-23 NOTE — ED Notes (Signed)
Pt changed into behavioral scrubs - not wearing underwear, given mask to wear, pt has no shoes, pt denies having cell phone and wallet, jackets not searched but pants were rolled up and appeared empty, sterile cup with earrings in pt belongings bag   1 bag to storage

## 2020-12-23 NOTE — BH Assessment (Signed)
With the pt's consent this writer attempted to contact pt's mother [phone]) for collateral however pt's mother was in the middle of report. Mother agreed to call back ASAP as she wanted to talk with someone about pt anyway. Mother was provided a call back number. Mother thanked this Clinical research associate for reaching out.

## 2020-12-23 NOTE — ED Provider Notes (Signed)
Telepsychiatrist evaluated patient. Rescinded IVC. Will give patient [facility] and [facility] information.   Phineas Semen, MD 12/23/20 [phone]
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
## 2020-12-23 NOTE — ED Notes (Signed)
Pt allowed to call his mother to inform her of his pending discharge.
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
## 2020-12-23 NOTE — ED Notes (Signed)
Pt given the phone to speak with his mother.  

## 2020-12-23 NOTE — ED Notes (Signed)
SOC completed 

## 2021-02-07 ENCOUNTER — Encounter: Payer: Self-pay | Admitting: Medical

## 2021-02-07 ENCOUNTER — Other Ambulatory Visit: Payer: Self-pay

## 2021-02-07 ENCOUNTER — Ambulatory Visit (INDEPENDENT_AMBULATORY_CARE_PROVIDER_SITE_OTHER): Payer: No Typology Code available for payment source | Admitting: Medical

## 2021-02-07 ENCOUNTER — Ambulatory Visit: Payer: Self-pay | Admitting: Medical

## 2021-02-07 VITALS — BP 128/70 | HR 100 | Ht 72.0 in | Wt 155.8 lb

## 2021-02-07 DIAGNOSIS — Z87898 Personal history of other specified conditions: Secondary | ICD-10-CM | POA: Diagnosis not present

## 2021-02-07 DIAGNOSIS — R4589 Other symptoms and signs involving emotional state: Secondary | ICD-10-CM | POA: Insufficient documentation

## 2021-02-07 DIAGNOSIS — G43009 Migraine without aura, not intractable, without status migrainosus: Secondary | ICD-10-CM | POA: Insufficient documentation

## 2021-02-07 DIAGNOSIS — L219 Seborrheic dermatitis, unspecified: Secondary | ICD-10-CM | POA: Insufficient documentation

## 2021-02-07 MED ORDER — FLUOCINOLONE ACETONIDE 0.01 % EX SHAM
1.0000 | MEDICATED_SHAMPOO | Freq: Every day | CUTANEOUS | 1 refills | Status: AC
Start: 2021-02-07 — End: ?

## 2021-02-07 MED ORDER — SUMATRIPTAN SUCCINATE 50 MG PO TABS: 50.0000 mg | ORAL_TABLET | ORAL | 0 refills | Status: AC | PRN

## 2021-02-07 NOTE — Progress Notes (Signed)
Subjective: Chief Complaint  Patient presents with  . New Patient (Initial Visit)    Pt present as new patient to establish care and wants itchy spots on head to be checked out    Here as a new patient.  Was seeing PCP in [place], [place] prior.  Here to establish care.    Has some itchy areas on scalp.   Had to use certain shampoo in past that helped.  Gets flaking of skin of scalp.  Last physical last year  Lives with girlfriend, and relationship the past year.  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  Has worked in several jobs in [employer]  Looking back through his chart record he has had several emergency department visits over the last several years related to alcohol intoxication.  We discussed this a bit.  He notes that when he drinks he is a different person.  He feels like he has overcome this now.  He is not currently seeing a counselor but has interest in doing this.  He wants to stay sober.  Been on probation for DUI.    Has history of migraines, about 1/week.  Some are mild, some are more severe.  Sometimes gets photophobia.  No specific trigger.  He is trying to get good sleep.  He is limiting caffeine.  Mother has hx/o migraines as well.  No prior prescription medicine for migraines  Past Medical History:  Diagnosis Date  . History of attention deficit hyperactivity disorder (ADHD)    Current Outpatient Medications on File Prior to Visit  Medication Sig Dispense Refill  . acetaminophen (TYLENOL) 500 MG tablet Take 1,000 mg by mouth every 6 (six) hours as needed for headache. (Patient not taking: Reported on 02/07/2021)     No current facility-administered medications on file prior to visit.   Review of systems as in subjective    Objective: BP 128/70   Pulse 100   Ht 6' (1.829 m)   Wt 155 lb 12.8 oz (70.7 kg)   SpO2 90%   BMI 21.13 kg/m      Vitals:   02/07/21 1326  BP: 128/70  Pulse: 100  SpO2: 90%    General appearence: alert,  no distress, WD/WN,  HEENT: normocephalic, sclerae anicteric, PERRLA, EOMi, nares patent, no discharge or erythema, pharynx normal Skin: Few scaly somewhat raised crusted lesions within the scalp suggestive of seborrheic dermatitis, no alopecia, no obvious fungal appearing lesions Neck: supple, no lymphadenopathy, no thyromegaly, no masses Heart: RRR, normal S1, S2, no murmurs Lungs: CTA bilaterally, no wheezes, rhonchi, or rales Extremities: no edema, no cyanosis, no clubbing Pulses: 2+ symmetric, upper and lower extremities, normal cap refill Neurological: alert, oriented x 3, CN2-12 intact, strength normal upper extremities and lower extremities, sensation normal throughout, DTRs 2+ throughout, no cerebellar signs, gait normal Psychiatric: normal affect, behavior normal, pleasant     Assessment: Encounter Diagnoses  Name Primary?  . Seborrheic dermatitis of scalp Yes  . Migraine without aura and without status migrainosus, not intractable   . Depressed mood   . History of alcohol consumption      Plan Seborrheic dermatitis of scalp-continue Selsun Blue twice weekly, begin fluocinonide shampoo at a different time a day daily.  Recheck in 1 month  Migraines-avoid triggers, keep a headache diary and return in a month, can use Imitrex as needed for abortive therapy.  Discussed proper use of medicine, risk and benefits of medicine  Depressed mood, history of alcohol consumption-I  recommended we help refer him to a counselor.  He declines at the moment.  He notes that he has to serve a modified incarceration for the next several weeks.  After that time he wants to get in with a counselor.  He is remaining sober for now.  He is still working out his prior DUI conviction   [name] was seen today for new patient (initial visit).  Diagnoses and all orders for this visit:  Seborrheic dermatitis of scalp  Migraine without aura and without status migrainosus, not intractable  Depressed  mood  History of alcohol consumption  Other orders -     Fluocinolone Acetonide 0.01 % SHAM; Apply 1 application topically daily. -     SUMAtriptan (IMITREX) 50 MG tablet; Take 1 tablet (50 mg total) by mouth every 2 (two) hours as needed for migraine. May repeat in 2 hours if headache persists or recurs.   Follow-up in a month for fasting physical

## 2021-03-21 ENCOUNTER — Other Ambulatory Visit: Payer: Self-pay

## 2021-03-21 ENCOUNTER — Encounter: Payer: Self-pay | Admitting: Medical

## 2021-03-21 ENCOUNTER — Ambulatory Visit (INDEPENDENT_AMBULATORY_CARE_PROVIDER_SITE_OTHER): Payer: No Typology Code available for payment source | Admitting: Medical

## 2021-03-21 VITALS — BP 118/76 | HR 77 | Temp 97.9°F | Ht 71.5 in | Wt 164.6 lb

## 2021-03-21 DIAGNOSIS — Z87898 Personal history of other specified conditions: Secondary | ICD-10-CM

## 2021-03-21 DIAGNOSIS — Z8659 Personal history of other mental and behavioral disorders: Secondary | ICD-10-CM

## 2021-03-21 DIAGNOSIS — Z13220 Encounter for screening for lipoid disorders: Secondary | ICD-10-CM

## 2021-03-21 DIAGNOSIS — Z0000 Encounter for general adult medical examination without abnormal findings: Secondary | ICD-10-CM

## 2021-03-21 DIAGNOSIS — Z1329 Encounter for screening for other suspected endocrine disorder: Secondary | ICD-10-CM

## 2021-03-21 DIAGNOSIS — G43009 Migraine without aura, not intractable, without status migrainosus: Secondary | ICD-10-CM

## 2021-03-21 DIAGNOSIS — Z131 Encounter for screening for diabetes mellitus: Secondary | ICD-10-CM

## 2021-03-21 DIAGNOSIS — L219 Seborrheic dermatitis, unspecified: Secondary | ICD-10-CM

## 2021-03-21 NOTE — Progress Notes (Signed)
Subjective:   HPI  Jerry Lawrence is a 25 y.o. male who presents for Chief Complaint  Patient presents with  . Annual Exam    CPE no other issues needs to get back to the eye doctor and dentist been over a year but will set up soon.    Patient Care Team: Garmon Dehn, Cleda Mccreedy as PCP - General (Family Medicine) Sees dentist  Concerns: None, doing fine.  From last visit has not had any significant migraines.    No new c/o.  Reviewed their medical, surgical, family, social, medication, and allergy history and updated chart as appropriate.  Past Medical History:  Diagnosis Date  . Alcohol abuse    prior as of 03/2021  . History of attention deficit hyperactivity disorder (ADHD)   . Migraine     Past Surgical History:  Procedure Laterality Date  . FINGER SURGERY  06/2011   left hand 5th finger, trauma related reconstruction    Family History  Problem Relation Age of Onset  . Migraines Mother   . Cancer Neg Hx   . Heart disease Neg Hx   . Stroke Neg Hx   . Hypertension Neg Hx   . Diabetes Neg Hx      Current Outpatient Medications:  .  SUMAtriptan (IMITREX) 50 MG tablet, Take 1 tablet (50 mg total) by mouth every 2 (two) hours as needed for migraine. May repeat in 2 hours if headache persists or recurs., Disp: 10 tablet, Rfl: 0 .  Fluocinolone Acetonide 0.01 % SHAM, Apply 1 application topically daily. (Patient not taking: Reported on 03/21/2021), Disp: 120 mL, Rfl: 1  No Known Allergies   Review of Systems Constitutional: -fever, -chills, -sweats, -unexpected weight change, -decreased appetite, -fatigue Allergy: -sneezing, -itching, -congestion Dermatology: -changing moles, --rash, -lumps ENT: -runny nose, -ear pain, -sore throat, -hoarseness, -sinus pain, -teeth pain, - ringing in ears, -hearing loss, -nosebleeds Cardiology: -chest pain, -palpitations, -swelling, -difficulty breathing when lying flat, -waking up short of breath Respiratory: -cough, -shortness  of breath, -difficulty breathing with exercise or exertion, -wheezing, -coughing up blood Gastroenterology: -abdominal pain, -nausea, -vomiting, -diarrhea, -constipation, -blood in stool, -changes in bowel movement, -difficulty swallowing or eating Hematology: -bleeding, -bruising  Musculoskeletal: -joint aches, -muscle aches, -joint swelling, -back pain, -neck pain, -cramping, -changes in gait Ophthalmology: denies vision changes, eye redness, itching, discharge Urology: -burning with urination, -difficulty urinating, -blood in urine, -urinary frequency, -urgency, -incontinence Neurology: -headache, -weakness, -tingling, -numbness, -memory loss, -falls, -dizziness Psychology: -depressed mood, -agitation, -sleep problems Male GU: no testicular mass, pain, no lymph nodes swollen, no swelling, no rash.  Depression screen [facility] 2/9 03/21/2021 02/07/2021  Decreased Interest 0 0  Down, Depressed, Hopeless 0 0  PHQ - 2 Score 0 0        Objective:  BP 118/76   Pulse 77   Temp 97.9 F (36.6 C)   Ht 5' 11.5" (1.816 m)   Wt 164 lb 9.6 oz (74.7 kg)   BMI 22.64 kg/m   General appearance: alert, no distress, WD/WN, Caucasian male Skin: few scattered macules, few thickened plaques within scalp c/w seborrhea dermatitis HEENT: normocephalic, conjunctiva/corneas normal, sclerae anicteric, PERRLA, EOMi, nares patent, no discharge or erythema, pharynx normal Neck: supple, no lymphadenopathy, no thyromegaly, no masses, normal ROM, no bruits Chest: non tender, normal shape and expansion Heart: RRR, normal S1, S2, no murmurs Lungs: CTA bilaterally, no wheezes, rhonchi, or rales Abdomen: +bs, soft, non tender, non distended, no masses, no hepatomegaly, no splenomegaly, no bruits Back:  non tender, normal ROM, no scoliosis Musculoskeletal: upper extremities non tender, no obvious deformity, normal ROM throughout, lower extremities non tender, no obvious deformity, normal ROM throughout Extremities: no edema,  no cyanosis, no clubbing Pulses: 2+ symmetric, upper and lower extremities, normal cap refill Neurological: alert, oriented x 3, CN2-12 intact, strength normal upper extremities and lower extremities, sensation normal throughout, DTRs 2+ throughout, no cerebellar signs, gait normal Psychiatric: normal affect, behavior normal, pleasant  GU: normal male external genitalia,circumcised, nontender, no masses, no hernia, no lymphadenopathy Rectal: deferred   Assessment and Plan :   Encounter Diagnoses  Name Primary?  . Encounter for health maintenance examination in adult Yes  . Screening for lipid disorders   . Screening for diabetes mellitus   . Screening for thyroid disorder   . Seborrheic dermatitis of scalp   . Migraine without aura and without status migrainosus, not intractable   . History of alcohol consumption   . History of ADHD     This visit was a preventative care visit, also known as wellness visit or routine physical.   Topics typically include healthy lifestyle, diet, exercise, preventative care, vaccinations, sick and well care, proper use of emergency dept and after hours care, as well as other concerns.     Recommendations: Continue to return yearly for your annual wellness and preventative care visits.  This gives [place] a chance to discuss healthy lifestyle, exercise, vaccinations, review your chart record, and perform screenings where appropriate.  I recommend you see your dentist yearly for routine dental care including hygiene visits twice yearly.   Vaccination recommendations were reviewed Immunization History  Administered Date(s) Administered  . Tdap 06/11/2015, 12/23/2020   Advised HPV #3 but he declines .  Advised yearly flu shot    Screening for cancer: Colon cancer screening: Age 61  Testicular cancer screening You should do a monthly self testicular exam if you are between 72-18 years old  We discussed PSA, prostate exam, and prostate cancer  screening risks/benefits.   Age 23  Skin cancer screening: Check your skin regularly for new changes, growing lesions, or other lesions of concern Come in for evaluation if you have skin lesions of concern.  Lung cancer screening: If you have a greater than 20 pack year history of tobacco use, then you may qualify for lung cancer screening with a chest CT scan.   Please call your insurance company to inquire about coverage for this test.  We currently don't have screenings for other cancers besides breast, cervical, colon, and lung cancers.  If you have a strong family history of cancer or have other cancer screening concerns, please let me know.    Bone health: Get at least 150 minutes of aerobic exercise weekly Get weight bearing exercise at least once weekly Bone density test:   A bone density test is an imaging test that uses a type of X-ray to measure the amount of calcium and other minerals in your bones.  The test may be used to diagnose or screen you for a condition that causes weak or thin bones (osteoporosis), predict your risk for a broken bone (fracture), or determine how well your osteoporosis treatment is working. The bone density test is recommended for females 65 and older, or females or males <65 if certain risk factors such as thyroid disease, long term use of steroids such as for asthma or rheumatological issues, vitamin D deficiency, estrogen deficiency, family history of osteoporosis, self or family history of fragility fracture in  first degree relative.    Heart health: Get at least 150 minutes of aerobic exercise weekly Limit alcohol It is important to maintain a healthy blood pressure and healthy cholesterol numbers  Heart disease screening: Screening for heart disease includes screening for blood pressure, fasting lipids, glucose/diabetes screening, BMI height to weight ratio, reviewed of smoking status, physical activity, and diet.    Goals include blood  pressure 120/80 or less, maintaining a healthy lipid/cholesterol profile, preventing diabetes or keeping diabetes numbers under good control, not smoking or using tobacco products, exercising most days per week or at least 150 minutes per week of exercise, and eating healthy variety of fruits and vegetables, healthy oils, and avoiding unhealthy food choices like fried food, fast food, high sugar and high cholesterol foods.    Other tests may possibly include EKG test, CT coronary calcium score, echocardiogram, exercise treadmill stress test.     Medical care options: I recommend you continue to seek care here first for routine care.  We try really hard to have available appointments Monday through Friday daytime hours for sick visits, acute visits, and physicals.  Urgent care should be used for after hours and weekends for significant issues that cannot wait till the next day.  The emergency department should be used for significant potentially life-threatening emergencies.  The emergency department is expensive, can often have long wait times for less significant concerns, so try to utilize primary care, urgent care, or telemedicine when possible to avoid unnecessary trips to the emergency department.  Virtual visits and telemedicine have been introduced since the pandemic started in 2020, and can be convenient ways to receive medical care.  We offer virtual appointments as well to assist you in a variety of options to seek medical care.    Robey was seen today for annual exam.  Diagnoses and all orders for this visit:  Encounter for health maintenance examination in adult -     Glucose, Random -     Lipid panel -     TSH  Screening for lipid disorders -     Lipid panel  Screening for diabetes mellitus -     Glucose, Random  Screening for thyroid disorder -     TSH  Seborrheic dermatitis of scalp  Migraine without aura and without status migrainosus, not intractable  History of  alcohol consumption  History of ADHD   Follow-up pending labs, yearly for physical

## 2021-03-21 NOTE — Patient Instructions (Signed)

## 2021-03-22 LAB — LIPID PANEL
Chol/HDL Ratio: 2.4 ratio (ref 0.0–5.0)
Cholesterol, Total: 123 mg/dL (ref 100–199)
HDL: 51 mg/dL (ref >39)
LDL Chol Calc (NIH): 59 mg/dL (ref 0–99)
Triglycerides: 58 mg/dL (ref 0–149)
VLDL Cholesterol Cal: 13 mg/dL (ref 5–40)

## 2021-03-22 LAB — TSH: TSH: 1.7 u[IU]/mL (ref 0.450–4.500)

## 2021-03-22 LAB — GLUCOSE, RANDOM: Glucose: 99 mg/dL (ref 65–99)

## 2021-04-29 IMAGING — CT CT HEAD W/O CM
3 series · 16 of 47 positions shown, 19 images · non-contrast
Comparison: 03/18/2018

CLINICAL DATA: Seizure, fell, left posterior scalp swelling

EXAM:
CT HEAD WITHOUT CONTRAST
TECHNIQUE: Contiguous axial images were obtained from the base of the skull
through the vertex without intravenous contrast.

[Series 3: head wo · axial · 0.42mm/px · z∈[-206,-81]mm · 10 of 31 slices shown, 13 images]
[im 3/31  brain]
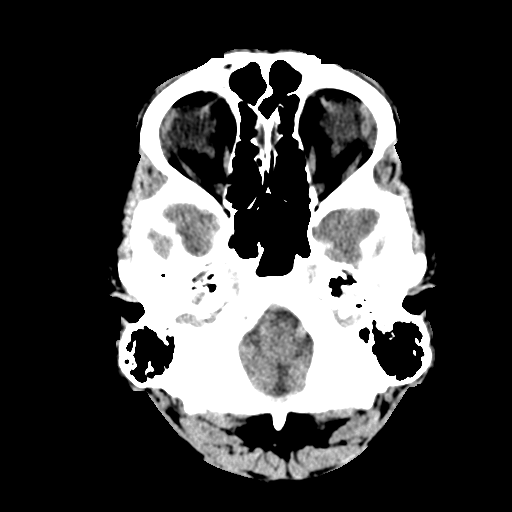
[im 3/31  bone]
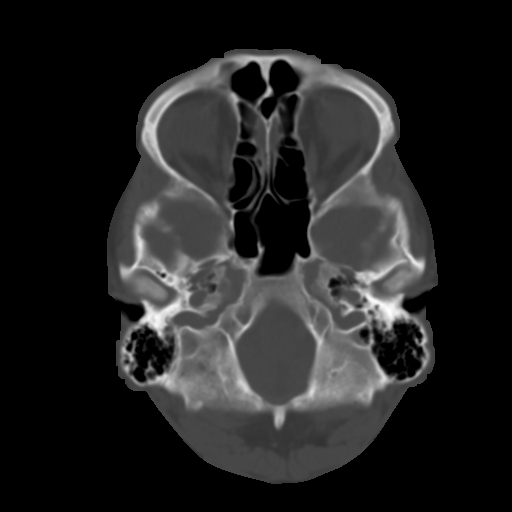
[im 6/31  brain]
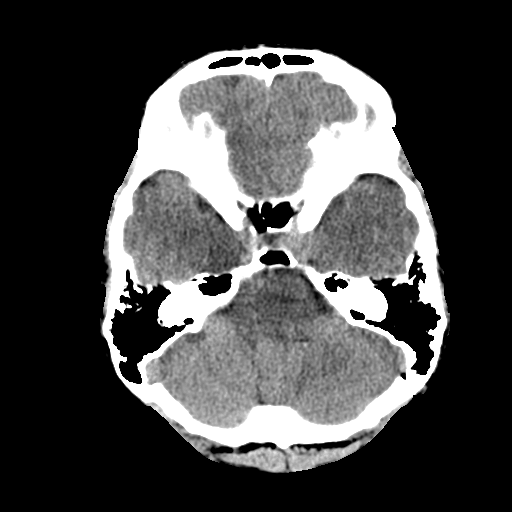
[im 9/31  brain]
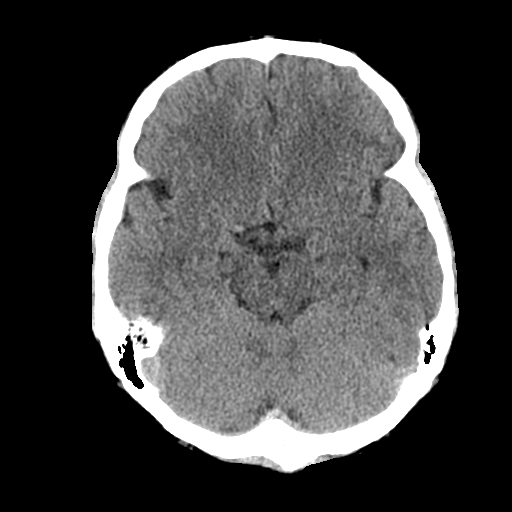
[im 11/31  brain]
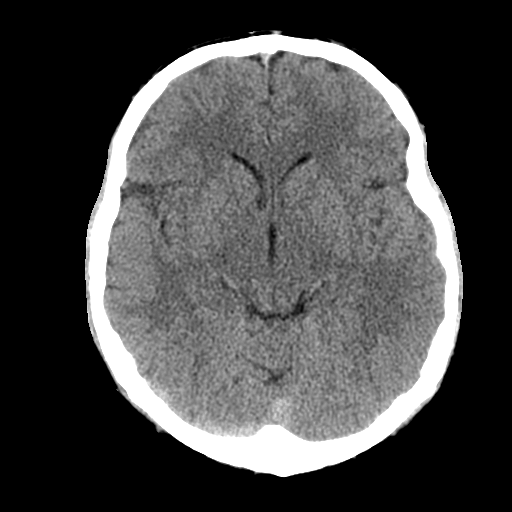
[im 14/31  brain]
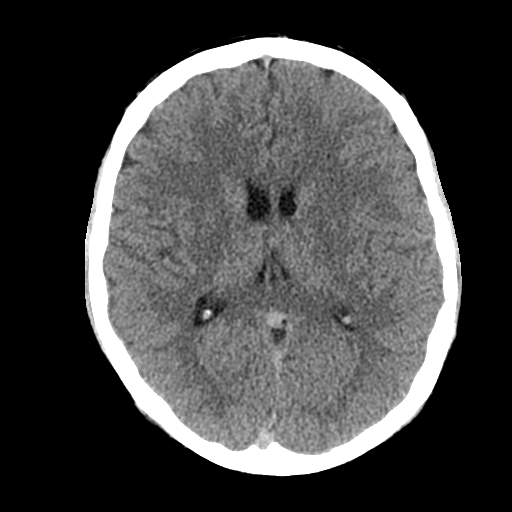
[im 14/31  bone]
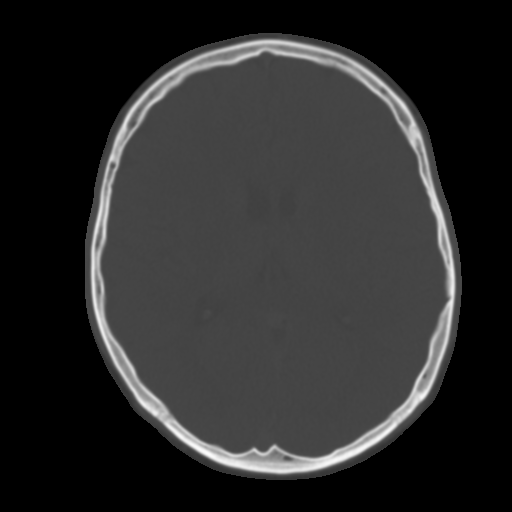
[im 17/31  brain]
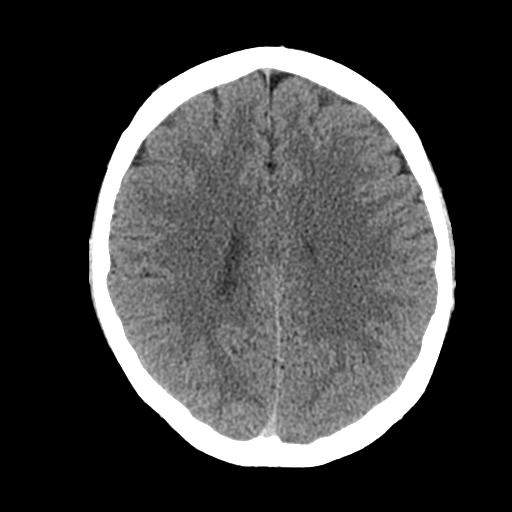
[im 20/31  brain]
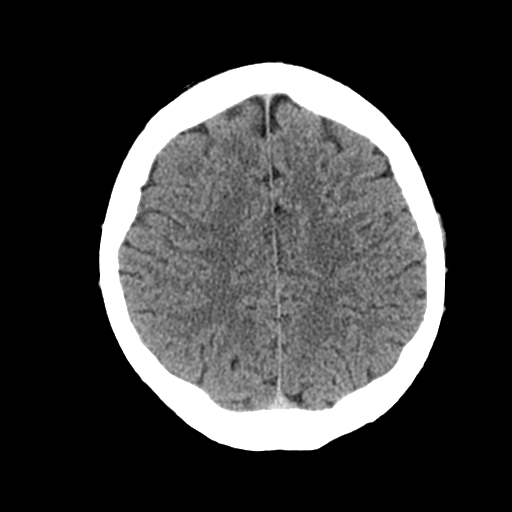
[im 23/31  brain]
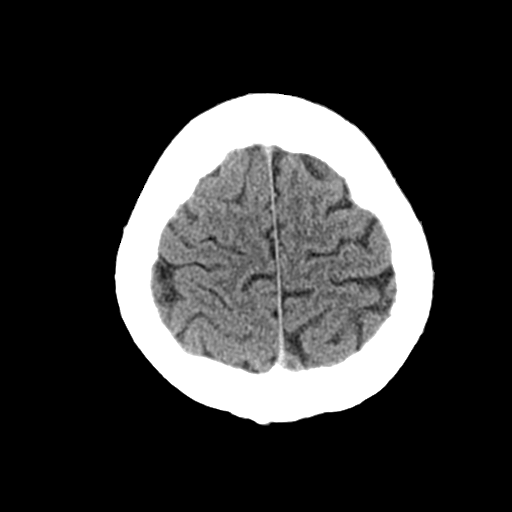
[im 25/31  brain]
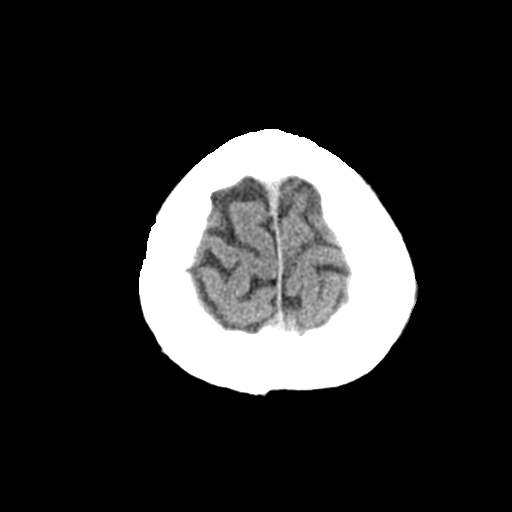
[im 25/31  bone]
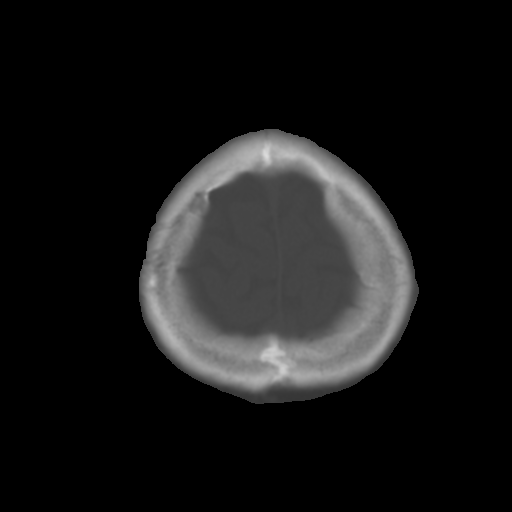
[im 28/31  brain]
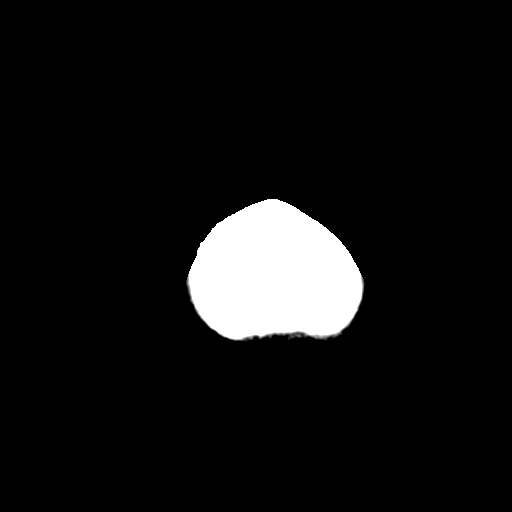

[Series 4: coronal soft tissue · coronal · 0.29mm/px · 3 of 65 slices shown]
[im 22/65  brain]
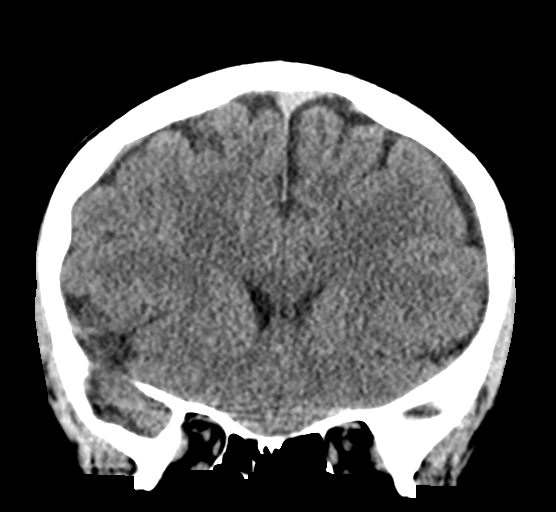
[im 29/65  brain]
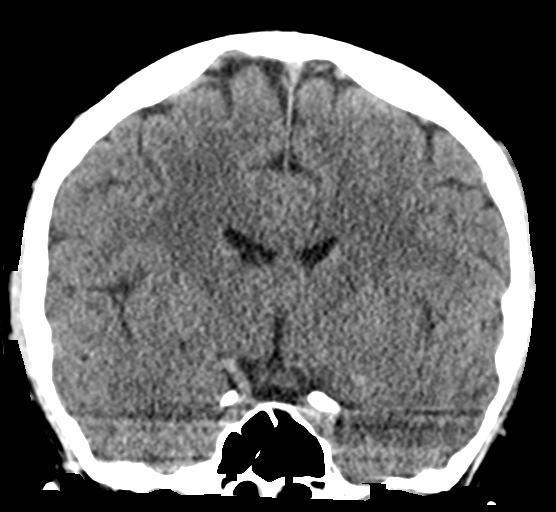
[im 36/65  brain]
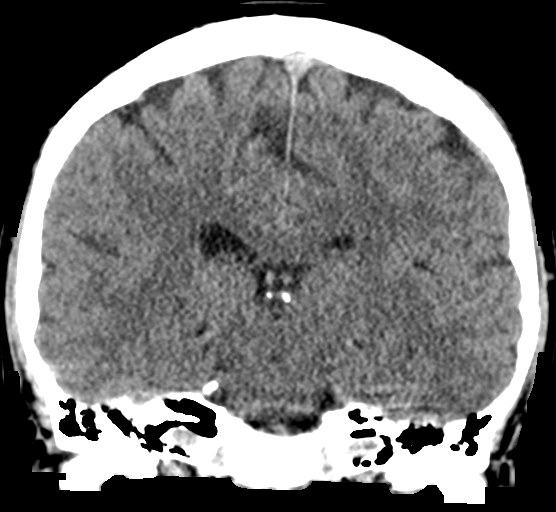

[Series 5: sagittal soft tissue · sagittal · 0.29mm/px · 3 of 55 slices shown]
[im 19/55  brain]
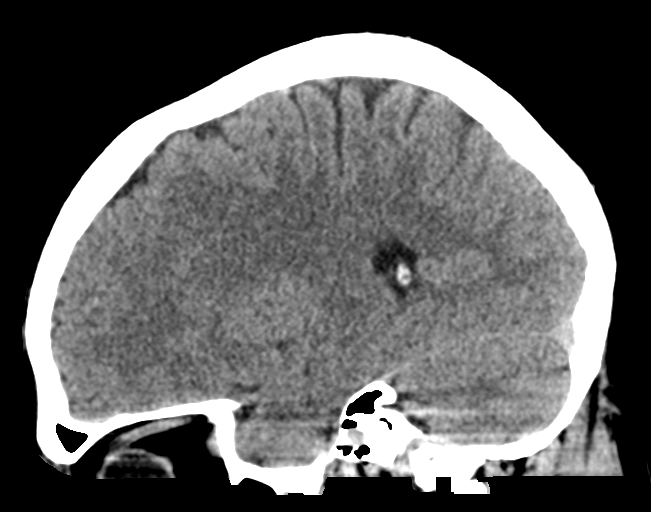
[im 28/55  brain]
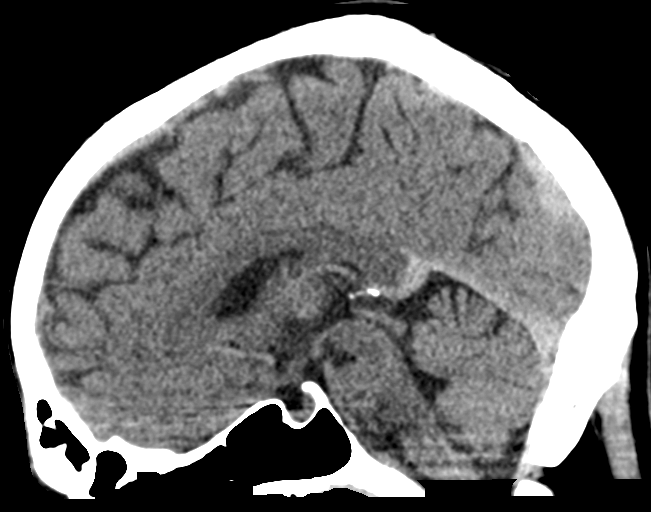
[im 37/55  brain]
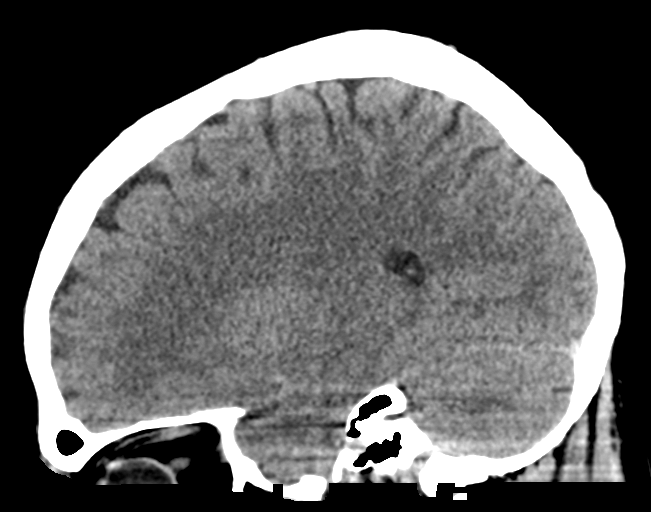

[16 of 47 positions shown; findings below may reference images not displayed]

FINDINGS: Brain: No acute infarct or hemorrhage. Lateral ventricles and
midline structures are unremarkable. No acute extra-axial fluid
collections. No mass effect.

Vascular: No hyperdense vessel or unexpected calcification.

Skull: Normal. Negative for fracture or focal lesion.

Sinuses/Orbits: No acute finding.

Other: None.
IMPRESSION: 1. Stable head CT, no acute process.

## 2022-03-26 ENCOUNTER — Encounter: Payer: No Typology Code available for payment source | Admitting: Medical

## 2022-03-26 NOTE — Progress Notes (Deleted)
P23  Mig[employer] ETOH [employer]

## 2022-03-28 ENCOUNTER — Encounter: Payer: Self-pay | Admitting: Medical
